# Patient Record
Sex: Female | Born: 1990 | Race: Black or African American | Hispanic: No | Marital: Single | Smoking: Never smoker
Health system: Southern US, Community
[De-identification: ages and names within clinical notes are randomized; demographics above are authoritative.]

## PROBLEM LIST (undated history)

## (undated) ENCOUNTER — Inpatient Hospital Stay (HOSPITAL_COMMUNITY): Payer: Self-pay

## (undated) DIAGNOSIS — O24419 Gestational diabetes mellitus in pregnancy, unspecified control: Secondary | ICD-10-CM

## (undated) DIAGNOSIS — E049 Nontoxic goiter, unspecified: Secondary | ICD-10-CM

## (undated) DIAGNOSIS — O139 Gestational [pregnancy-induced] hypertension without significant proteinuria, unspecified trimester: Secondary | ICD-10-CM

## (undated) HISTORY — PX: NO PAST SURGERIES: SHX2092

## (undated) HISTORY — DX: Nontoxic goiter, unspecified: E04.9

## (undated) HISTORY — DX: Gestational diabetes mellitus in pregnancy, unspecified control: O24.419

---
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
## 2009-04-22 ENCOUNTER — Emergency Department (HOSPITAL_COMMUNITY): Admission: EM | Admit: 2009-04-22 | Discharge: 2009-04-23 | Payer: Self-pay | Admitting: Emergency Medicine

## 2010-05-29 LAB — WET PREP, GENITAL
Trich, Wet Prep: NONE SEEN
Yeast Wet Prep HPF POC: NONE SEEN

## 2010-05-29 LAB — URINALYSIS, ROUTINE W REFLEX MICROSCOPIC
Bilirubin Urine: NEGATIVE
Ketones, ur: NEGATIVE mg/dL
Specific Gravity, Urine: 1.026 (ref 1.005–1.030)

## 2010-05-29 LAB — GC/CHLAMYDIA PROBE AMP, GENITAL: Chlamydia, DNA Probe: NEGATIVE

## 2010-05-29 LAB — POCT PREGNANCY, URINE: Preg Test, Ur: NEGATIVE

## 2010-06-25 ENCOUNTER — Emergency Department (HOSPITAL_COMMUNITY)
Admission: EM | Admit: 2010-06-25 | Discharge: 2010-06-25 | Discharge status: Against Medical Advice | Payer: Self-pay | Attending: Emergency Medicine | Admitting: Emergency Medicine

## 2010-06-25 DIAGNOSIS — M79609 Pain in unspecified limb: Secondary | ICD-10-CM | POA: Insufficient documentation

## 2010-06-26 ENCOUNTER — Emergency Department (HOSPITAL_COMMUNITY)
Admission: EM | Admit: 2010-06-26 | Discharge: 2010-06-26 | Disposition: A | Discharge status: Home / Self Care | Payer: Self-pay | Attending: Emergency Medicine | Admitting: Emergency Medicine

## 2010-06-26 DIAGNOSIS — M79609 Pain in unspecified limb: Secondary | ICD-10-CM | POA: Insufficient documentation

## 2010-06-26 DIAGNOSIS — M25476 Effusion, unspecified foot: Secondary | ICD-10-CM | POA: Insufficient documentation

## 2010-06-26 DIAGNOSIS — R609 Edema, unspecified: Secondary | ICD-10-CM | POA: Insufficient documentation

## 2010-06-26 DIAGNOSIS — M7989 Other specified soft tissue disorders: Secondary | ICD-10-CM | POA: Insufficient documentation

## 2010-06-26 DIAGNOSIS — M25473 Effusion, unspecified ankle: Secondary | ICD-10-CM | POA: Insufficient documentation

## 2010-06-26 LAB — POCT I-STAT, CHEM 8
Glucose, Bld: 85 mg/dL (ref 70–99)
HCT: 40 % (ref 36.0–46.0)
Hemoglobin: 13.6 g/dL (ref 12.0–15.0)
Sodium: 142 mEq/L (ref 135–145)

## 2010-11-19 ENCOUNTER — Encounter: Payer: Self-pay | Admitting: Family Medicine

## 2010-12-17 LAB — ANTIBODY SCREEN: Antibody Screen: NEGATIVE

## 2010-12-30 IMAGING — CR DG ABDOMEN 1V
1 series · 1 of 1 positions shown · non-contrast
Comparison: None.

CLINICAL DATA: Abdominal pain.  Pain with voiding.  Pain over the
bladder.

ABDOMEN - 1 VIEW

[t abdomen supine]
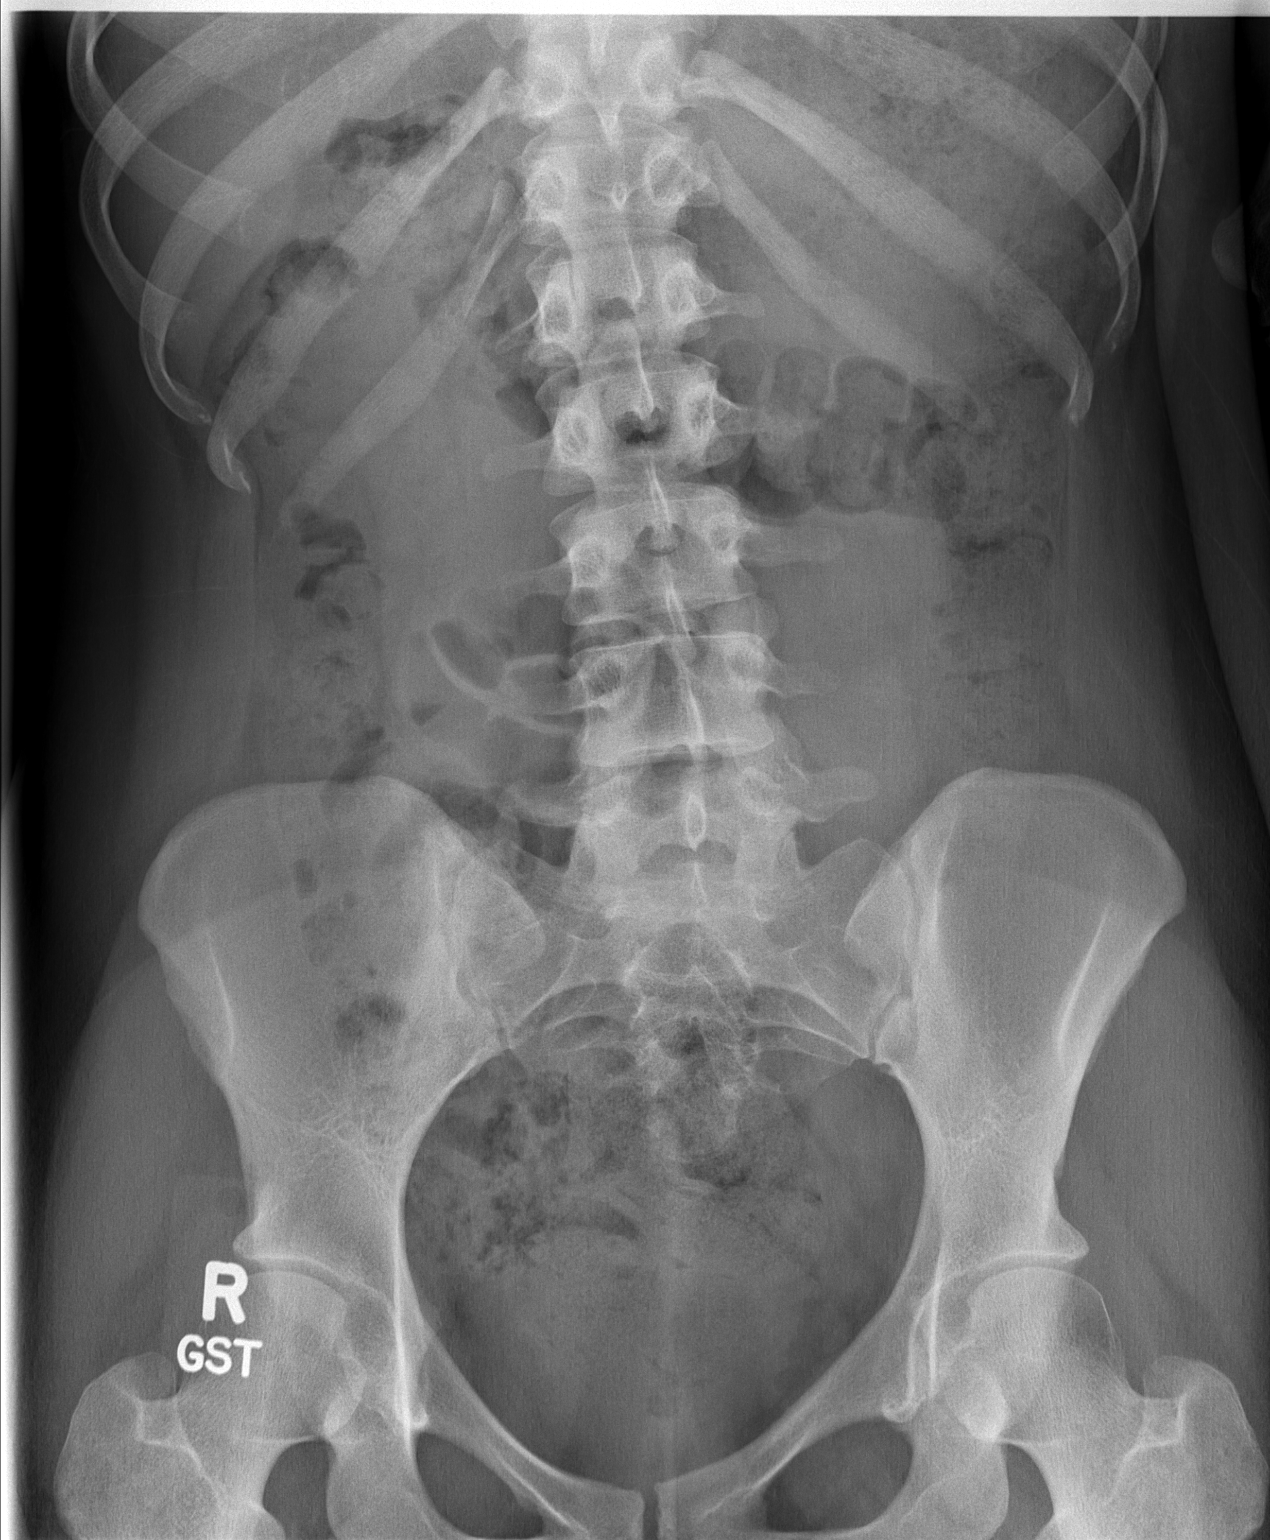

[1 of 1 positions shown; findings below may reference images not displayed]

FINDINGS: Bowel gas pattern is normal.  Stool and bowel gas extend
to the level of the rectosigmoid.  Bones appear within normal
limits.
IMPRESSION: Normal abdominal radiographs.

## 2011-03-11 NOTE — L&D Delivery Note (Signed)
Delivery Note At 7:53 PM a viable female was delivered via Vaginal, Spontaneous Delivery (Presentation vertex, Direct Occiput Anterior).  APGAR: 9, 9; weight 7 lb 14 oz .   Placenta status: Intact, Spontaneous.  Cord: 3 vessels with the following complications: None.  Post delivery brisk bleeding noted, bimanual massage resulted in firming up of the uterus and bleeding stopped except for at laceration.  Anesthesia: Epidural Local  Episiotomy: None Lacerations: 2nd degree;Perineal Suture Repair: 2.0 Vicryl Est. Blood Loss (mL): 650  Mom to postpartum.  Baby to nursery-stable.  Kennede Lusk S 06/10/2011, 8:12 PM

## 2011-04-15 ENCOUNTER — Inpatient Hospital Stay (HOSPITAL_COMMUNITY)
Admission: AD | Admit: 2011-04-15 | Discharge: 2011-04-15 | Disposition: A | Discharge status: Home / Self Care | Payer: Medicaid Other | Source: Ambulatory Visit | Attending: Obstetrics & Gynecology | Admitting: Obstetrics & Gynecology

## 2011-04-15 ENCOUNTER — Encounter (HOSPITAL_COMMUNITY): Payer: Self-pay | Admitting: *Deleted

## 2011-04-15 ENCOUNTER — Inpatient Hospital Stay (HOSPITAL_COMMUNITY): Payer: Medicaid Other

## 2011-04-15 ENCOUNTER — Encounter: Payer: Self-pay | Admitting: Obstetrics & Gynecology

## 2011-04-15 DIAGNOSIS — O4700 False labor before 37 completed weeks of gestation, unspecified trimester: Secondary | ICD-10-CM | POA: Insufficient documentation

## 2011-04-15 DIAGNOSIS — R109 Unspecified abdominal pain: Secondary | ICD-10-CM

## 2011-04-15 DIAGNOSIS — O283 Abnormal ultrasonic finding on antenatal screening of mother: Secondary | ICD-10-CM | POA: Insufficient documentation

## 2011-04-15 DIAGNOSIS — R1012 Left upper quadrant pain: Secondary | ICD-10-CM | POA: Insufficient documentation

## 2011-04-15 LAB — DIFFERENTIAL
Basophils Absolute: 0 10*3/uL (ref 0.0–0.1)
Basophils Relative: 0 % (ref 0–1)
Eosinophils Relative: 2 % (ref 0–5)
Lymphocytes Relative: 17 % (ref 12–46)
Lymphs Abs: 1.3 10*3/uL (ref 0.7–4.0)
Neutrophils Relative %: 77 % (ref 43–77)

## 2011-04-15 LAB — URINALYSIS, ROUTINE W REFLEX MICROSCOPIC
Bilirubin Urine: NEGATIVE
Glucose, UA: NEGATIVE mg/dL
Hgb urine dipstick: NEGATIVE
Leukocytes, UA: NEGATIVE
Protein, ur: NEGATIVE mg/dL

## 2011-04-15 LAB — CBC
HCT: 27.6 % — ABNORMAL LOW (ref 36.0–46.0)
MCHC: 33.7 g/dL (ref 30.0–36.0)
MCV: 93.9 fL (ref 78.0–100.0)
RBC: 2.94 MIL/uL — ABNORMAL LOW (ref 3.87–5.11)

## 2011-04-15 LAB — WET PREP, GENITAL
Clue Cells Wet Prep HPF POC: NONE SEEN
Trich, Wet Prep: NONE SEEN
Yeast Wet Prep HPF POC: NONE SEEN

## 2011-04-15 NOTE — ED Provider Notes (Signed)
History     Chief Complaint  Patient presents with  . Abdominal Pain   HPI 21 y/o F  G1P0 with 32.1 weeks that comes today complaining of +abdominal pain. The pain is located on left upper quadrant and irradiated down to her vagina. She reported had sexual intercourse in the morning and after that she started feeling the pain. No vaginal discharge, leaking fluid or bleeding. No headaches, epigastric pain, vision disturbances or lower extremity edema. No other complaints at this time. She had prenatal care at the beginning of her pregnancy but there was a problem with insurance and she was not able to get care after Nov/ 2012.  OB History    Grav Para Term Preterm Abortions TAB SAB Ect Mult Living   1               Past Medical History  Diagnosis Date  . No pertinent past medical history     Past Surgical History  Procedure Date  . No past surgeries     Family History  Problem Relation Age of Onset  . Diabetes Father   . Diabetes Paternal Grandfather     History  Substance Use Topics  . Smoking status: Never Smoker   . Smokeless tobacco: Not on file  . Alcohol Use: No    Allergies: No Known Allergies  Prescriptions prior to admission  Medication Sig Dispense Refill  . Prenatal Vit-Fe Fumarate-FA (PRENATAL MULTIVITAMIN) TABS Take 1 tablet by mouth daily.        Review of Systems  Constitutional: Negative for fever.  HENT: Negative.   Eyes: Negative for blurred vision.  Respiratory: Negative for cough.   Cardiovascular: Negative for chest pain and palpitations.  Gastrointestinal: Positive for abdominal pain. Negative for heartburn, nausea and vomiting.  Genitourinary: Negative for dysuria, urgency and frequency.  Musculoskeletal: Negative for back pain.  Skin: Negative.   Neurological: Negative.  Negative for headaches.   Physical Exam   Blood pressure 135/89, pulse 103, temperature 98.4 F (36.9 C), temperature source Oral, resp. rate 18, height 5' 3.5"  (1.613 m), weight 81.647 kg (180 lb).  Physical Exam  Constitutional: She is oriented to person, place, and time. No distress.  HENT:  Mouth/Throat: Oropharynx is clear and moist.  Eyes: Conjunctivae and EOM are normal. Pupils are equal, round, and reactive to light.  Neck: Neck supple.  Cardiovascular: Normal rate, regular rhythm, normal heart sounds and intact distal pulses.   No murmur heard. Respiratory: Effort normal and breath sounds normal. No respiratory distress.  GI: Bowel sounds are normal.       Normal abdominal physical exam of a 32 w gravid pt. No tenderness on palpation, rebound or guarding.  Genitourinary:       Speculum: minimal vaginal discharge. Normal cervix. Bimanual: cervix closed, long and posterior.  Musculoskeletal: She exhibits no edema.  Neurological: She is alert and oriented to person, place, and time. She has normal reflexes.  Skin: Skin is warm and dry.    MAU Course  Procedures  MDM FHT category 1 Toco irregular contractions that subside after oral hydration and rest.   GC and chlamydia collected Wet prep: negative for clue cells/yeast/tric OB U/S complete: with echogenic foci in LV Negative urinalysis w/micro  Assessment and Plan  21 y/o F  G1P0 with 32.1 weeks with abdominal pain and contractions after sexual activity this morning. Cervix unchanged, FHT category 1, no contractions after oral hydration. FFN not collected due to intercourse within 56  h.  Preterm labor signs and symptoms discussed. Communication sent to [employer] to call pt to schedule f/u   D. Piloto [employer]. MD PGY-1 04/15/2011, 3:09 PM

## 2011-04-15 NOTE — Progress Notes (Signed)
Pt in c/o left sided abdominal pain since yesterday.  States pain are sharp with occasional tightening.  States pain is worst with fetal movement on left side.  Worse with movement and sitting down. Denies any bleeding or leaking of fluid.  + FM.

## 2011-04-16 NOTE — ED Provider Notes (Signed)
Attestation of Attending Supervision of Resident: Evaluation and management procedures were performed by the [facility] Medicine Resident under my supervision.  I have reviewed the resident's note, chart reviewed and agree with management and plan.  Jaynie Collins, M.D. 04/16/2011 9:42 AM

## 2011-04-23 ENCOUNTER — Encounter: Payer: Self-pay | Admitting: Physician Assistant

## 2011-04-23 ENCOUNTER — Ambulatory Visit (INDEPENDENT_AMBULATORY_CARE_PROVIDER_SITE_OTHER): Payer: Self-pay | Admitting: Physician Assistant

## 2011-04-23 VITALS — BP 127/77 | Temp 98.2°F | Wt 197.1 lb

## 2011-04-23 DIAGNOSIS — O0930 Supervision of pregnancy with insufficient antenatal care, unspecified trimester: Secondary | ICD-10-CM

## 2011-04-23 DIAGNOSIS — E010 Iodine-deficiency related diffuse (endemic) goiter: Secondary | ICD-10-CM

## 2011-04-23 LAB — POCT URINALYSIS DIP (DEVICE)
Bilirubin Urine: NEGATIVE
Glucose, UA: NEGATIVE mg/dL
Ketones, ur: NEGATIVE mg/dL
Leukocytes, UA: NEGATIVE
Nitrite: NEGATIVE
Urobilinogen, UA: 0.2 mg/dL (ref 0.0–1.0)
pH: 7 (ref 5.0–8.0)

## 2011-04-23 NOTE — Progress Notes (Signed)
Lapse in care secondary to loss of insurance. Started with [facility] at 15 weeks, records in media. Size=dates. No complaints. 1 hour glucola today

## 2011-04-23 NOTE — Patient Instructions (Signed)
Pregnancy - Third Trimester The third trimester of pregnancy (the last 3 months) is a period of the most rapid growth for you and your baby. The baby approaches a length of 20 inches and a weight of 6 to 10 pounds. The baby is adding on fat and getting ready for life outside your body. While inside, babies have periods of sleeping and waking, suck their thumbs, and hiccups. You can often feel small contractions of the uterus. This is false labor. It is also called Braxton-Hicks contractions. This is like a practice for labor. The usual problems in this stage of pregnancy include more difficulty breathing, swelling of the hands and feet from water retention, and having to urinate more often because of the uterus and baby pressing on your bladder.  PRENATAL EXAMS  Blood work may continue to be done during prenatal exams. These tests are done to check on your health and the probable health of your baby. Blood work is used to follow your blood levels (hemoglobin). Anemia (low hemoglobin) is common during pregnancy. Iron and vitamins are given to help prevent this. You may also continue to be checked for diabetes. Some of the past blood tests may be done again.   The size of the uterus is measured during each visit. This makes sure your baby is growing properly according to your pregnancy dates.   Your blood pressure is checked every prenatal visit. This is to make sure you are not getting toxemia.   Your urine is checked every prenatal visit for infection, diabetes and protein.   Your weight is checked at each visit. This is done to make sure gains are happening at the suggested rate and that you and your baby are growing normally.   Sometimes, an ultrasound is performed to confirm the position and the proper growth and development of the baby. This is a test done that bounces harmless sound waves off the baby so your caregiver can more accurately determine due dates.   Discuss the type of pain  medication and anesthesia you will have during your labor and delivery.   Discuss the possibility and anesthesia if a Cesarean Section might be necessary.   Inform your caregiver if there is any mental or physical violence at home.  Sometimes, a specialized non-stress test, contraction stress test and biophysical profile are done to make sure the baby is not having a problem. Checking the amniotic fluid surrounding the baby is called an amniocentesis. The amniotic fluid is removed by sticking a needle into the belly (abdomen). This is sometimes done near the end of pregnancy if an early delivery is required. In this case, it is done to help make sure the baby's lungs are mature enough for the baby to live outside of the womb. If the lungs are not mature and it is unsafe to deliver the baby, an injection of cortisone medication is given to the mother 1 to 2 days before the delivery. This helps the baby's lungs mature and makes it safer to deliver the baby. CHANGES OCCURING IN THE THIRD TRIMESTER OF PREGNANCY Your body goes through many changes during pregnancy. They vary from person to person. Talk to your caregiver about changes you notice and are concerned about.  During the last trimester, you have probably had an increase in your appetite. It is normal to have cravings for certain foods. This varies from person to person and pregnancy to pregnancy.   You may begin to get stretch marks on your hips,   abdomen, and breasts. These are normal changes in the body during pregnancy. There are no exercises or medications to take which prevent this change.   Constipation may be treated with a stool softener or adding bulk to your diet. Drinking lots of fluids, fiber in vegetables, fruits, and whole grains are helpful.   Exercising is also helpful. If you have been very active up until your pregnancy, most of these activities can be continued during your pregnancy. If you have been less active, it is helpful  to start an exercise program such as walking. Consult your caregiver before starting exercise programs.   Avoid all smoking, alcohol, un-prescribed drugs, herbs and "street drugs" during your pregnancy. These chemicals affect the formation and growth of the baby. Avoid chemicals throughout the pregnancy to ensure the delivery of a healthy infant.   Backache, varicose veins and hemorrhoids may develop or get worse.   You will tire more easily in the third trimester, which is normal.   The baby's movements may be stronger and more often.   You may become short of breath easily.   Your belly button may stick out.   A yellow discharge may leak from your breasts called colostrum.   You may have a bloody mucus discharge. This usually occurs a few days to a week before labor begins.  HOME CARE INSTRUCTIONS   Keep your caregiver's appointments. Follow your caregiver's instructions regarding medication use, exercise, and diet.   During pregnancy, you are providing food for you and your baby. Continue to eat regular, well-balanced meals. Choose foods such as meat, fish, milk and other low fat dairy products, vegetables, fruits, and whole-grain breads and cereals. Your caregiver will tell you of the ideal weight gain.   A physical sexual relationship may be continued throughout pregnancy if there are no other problems such as early (premature) leaking of amniotic fluid from the membranes, vaginal bleeding, or belly (abdominal) pain.   Exercise regularly if there are no restrictions. Check with your caregiver if you are unsure of the safety of your exercises. Greater weight gain will occur in the last 2 trimesters of pregnancy. Exercising helps:   Control your weight.   Get you in shape for labor and delivery.   You lose weight after you deliver.   Rest a lot with legs elevated, or as needed for leg cramps or low back pain.   Wear a good support or jogging bra for breast tenderness during  pregnancy. This may help if worn during sleep. Pads or tissues may be used in the bra if you are leaking colostrum.   Do not use hot tubs, steam rooms, or saunas.   Wear your seat belt when driving. This protects you and your baby if you are in an accident.   Avoid raw meat, cat litter boxes and soil used by cats. These carry germs that can cause birth defects in the baby.   It is easier to loose urine during pregnancy. Tightening up and strengthening the pelvic muscles will help with this problem. You can practice stopping your urination while you are going to the bathroom. These are the same muscles you need to strengthen. It is also the muscles you would use if you were trying to stop from passing gas. You can practice tightening these muscles up 10 times a set and repeating this about 3 times per day. Once you know what muscles to tighten up, do not perform these exercises during urination. It is more likely   to cause an infection by backing up the urine.   Ask for help if you have financial, counseling or nutritional needs during pregnancy. Your caregiver will be able to offer counseling for these needs as well as refer you for other special needs.   Make a list of emergency phone numbers and have them available.   Plan on getting help from family or friends when you go home from the hospital.   Make a trial run to the hospital.   Take prenatal classes with the father to understand, practice and ask questions about the labor and delivery.   Prepare the baby's room/nursery.   Do not travel out of the city unless it is absolutely necessary and with the advice of your caregiver.   Wear only low or no heal shoes to have better balance and prevent falling.  MEDICATIONS AND DRUG USE IN PREGNANCY  Take prenatal vitamins as directed. The vitamin should contain 1 milligram of folic acid. Keep all vitamins out of reach of children. Only a couple vitamins or tablets containing iron may be fatal  to a baby or young child when ingested.   Avoid use of all medications, including herbs, over-the-counter medications, not prescribed or suggested by your caregiver. Only take over-the-counter or prescription medicines for pain, discomfort, or fever as directed by your caregiver. Do not use aspirin, ibuprofen (Motrin, Advil, Nuprin) or naproxen (Aleve) unless OK'd by your caregiver.   Let your caregiver also know about herbs you may be using.   Alcohol is related to a number of birth defects. This includes fetal alcohol syndrome. All alcohol, in any form, should be avoided completely. Smoking will cause low birth rate and premature babies.   Street/illegal drugs are very harmful to the baby. They are absolutely forbidden. A baby born to an addicted mother will be addicted at birth. The baby will go through the same withdrawal an adult does.  SEEK MEDICAL CARE IF: You have any concerns or worries during your pregnancy. It is better to call with your questions if you feel they cannot wait, rather than worry about them. DECISIONS ABOUT CIRCUMCISION You may or may not know the sex of your baby. If you know your baby is a boy, it may be time to think about circumcision. Circumcision is the removal of the foreskin of the penis. This is the skin that covers the sensitive end of the penis. There is no proven medical need for this. Often this decision is made on what is popular at the time or based upon religious beliefs and social issues. You can discuss these issues with your caregiver or pediatrician. SEEK IMMEDIATE MEDICAL CARE IF:   An unexplained oral temperature above 102 F (38.9 C) develops, or as your caregiver suggests.   You have leaking of fluid from the vagina (birth canal). If leaking membranes are suspected, take your temperature and tell your caregiver of this when you call.   There is vaginal spotting, bleeding or passing clots. Tell your caregiver of the amount and how many pads are  used.   You develop a bad smelling vaginal discharge with a change in the color from clear to white.   You develop vomiting that lasts more than 24 hours.   You develop chills or fever.   You develop shortness of breath.   You develop burning on urination.   You loose more than 2 pounds of weight or gain more than 2 pounds of weight or as suggested by your   caregiver.   You notice sudden swelling of your face, hands, and feet or legs.   You develop belly (abdominal) pain. Round ligament discomfort is a common non-cancerous (benign) cause of abdominal pain in pregnancy. Your caregiver still must evaluate you.   You develop a severe headache that does not go away.   You develop visual problems, blurred or double vision.   If you have not felt your baby move for more than 1 hour. If you think the baby is not moving as much as usual, eat something with sugar in it and lie down on your left side for an hour. The baby should move at least 4 to 5 times per hour. Call right away if your baby moves less than that.   You fall, are in a car accident or any kind of trauma.   There is mental or physical violence at home.  Document Released: 02/18/2001 Document Revised: 11/06/2010 Document Reviewed: 08/23/2008 [employer] Patient Information 2012 [employer]. 

## 2011-04-23 NOTE — Progress Notes (Signed)
1hr gtt today due at 1106

## 2011-04-27 LAB — CULTURE, OB URINE: Colony Count: >=100000

## 2011-04-30 ENCOUNTER — Ambulatory Visit (INDEPENDENT_AMBULATORY_CARE_PROVIDER_SITE_OTHER): Payer: Self-pay | Admitting: Obstetrics and Gynecology

## 2011-04-30 DIAGNOSIS — O0930 Supervision of pregnancy with insufficient antenatal care, unspecified trimester: Secondary | ICD-10-CM

## 2011-04-30 DIAGNOSIS — O0933 Supervision of pregnancy with insufficient antenatal care, third trimester: Secondary | ICD-10-CM

## 2011-04-30 DIAGNOSIS — O289 Unspecified abnormal findings on antenatal screening of mother: Secondary | ICD-10-CM

## 2011-04-30 DIAGNOSIS — O283 Abnormal ultrasonic finding on antenatal screening of mother: Secondary | ICD-10-CM

## 2011-04-30 LAB — POCT URINALYSIS DIP (DEVICE)
Bilirubin Urine: NEGATIVE
Glucose, UA: NEGATIVE mg/dL
Hgb urine dipstick: NEGATIVE
Leukocytes, UA: NEGATIVE
Nitrite: NEGATIVE
Urobilinogen, UA: 0.2 mg/dL (ref 0.0–1.0)
pH: 6.5 (ref 5.0–8.0)

## 2011-04-30 NOTE — Patient Instructions (Signed)
Pregnancy - Third Trimester The third trimester of pregnancy (the last 3 months) is a period of the most rapid growth for you and your baby. The baby approaches a length of 20 inches and a weight of 6 to 10 pounds. The baby is adding on fat and getting ready for life outside your body. While inside, babies have periods of sleeping and waking, suck their thumbs, and hiccups. You can often feel small contractions of the uterus. This is false labor. It is also called Braxton-Hicks contractions. This is like a practice for labor. The usual problems in this stage of pregnancy include more difficulty breathing, swelling of the hands and feet from water retention, and having to urinate more often because of the uterus and baby pressing on your bladder.  PRENATAL EXAMS  Blood work may continue to be done during prenatal exams. These tests are done to check on your health and the probable health of your baby. Blood work is used to follow your blood levels (hemoglobin). Anemia (low hemoglobin) is common during pregnancy. Iron and vitamins are given to help prevent this. You may also continue to be checked for diabetes. Some of the past blood tests may be done again.   The size of the uterus is measured during each visit. This makes sure your baby is growing properly according to your pregnancy dates.   Your blood pressure is checked every prenatal visit. This is to make sure you are not getting toxemia.   Your urine is checked every prenatal visit for infection, diabetes and protein.   Your weight is checked at each visit. This is done to make sure gains are happening at the suggested rate and that you and your baby are growing normally.   Sometimes, an ultrasound is performed to confirm the position and the proper growth and development of the baby. This is a test done that bounces harmless sound waves off the baby so your caregiver can more accurately determine due dates.   Discuss the type of pain  medication and anesthesia you will have during your labor and delivery.   Discuss the possibility and anesthesia if a Cesarean Section might be necessary.   Inform your caregiver if there is any mental or physical violence at home.  Sometimes, a specialized non-stress test, contraction stress test and biophysical profile are done to make sure the baby is not having a problem. Checking the amniotic fluid surrounding the baby is called an amniocentesis. The amniotic fluid is removed by sticking a needle into the belly (abdomen). This is sometimes done near the end of pregnancy if an early delivery is required. In this case, it is done to help make sure the baby's lungs are mature enough for the baby to live outside of the womb. If the lungs are not mature and it is unsafe to deliver the baby, an injection of cortisone medication is given to the mother 1 to 2 days before the delivery. This helps the baby's lungs mature and makes it safer to deliver the baby. CHANGES OCCURING IN THE THIRD TRIMESTER OF PREGNANCY Your body goes through many changes during pregnancy. They vary from person to person. Talk to your caregiver about changes you notice and are concerned about.  During the last trimester, you have probably had an increase in your appetite. It is normal to have cravings for certain foods. This varies from person to person and pregnancy to pregnancy.   You may begin to get stretch marks on your hips,   abdomen, and breasts. These are normal changes in the body during pregnancy. There are no exercises or medications to take which prevent this change.   Constipation may be treated with a stool softener or adding bulk to your diet. Drinking lots of fluids, fiber in vegetables, fruits, and whole grains are helpful.   Exercising is also helpful. If you have been very active up until your pregnancy, most of these activities can be continued during your pregnancy. If you have been less active, it is helpful  to start an exercise program such as walking. Consult your caregiver before starting exercise programs.   Avoid all smoking, alcohol, un-prescribed drugs, herbs and "street drugs" during your pregnancy. These chemicals affect the formation and growth of the baby. Avoid chemicals throughout the pregnancy to ensure the delivery of a healthy infant.   Backache, varicose veins and hemorrhoids may develop or get worse.   You will tire more easily in the third trimester, which is normal.   The baby's movements may be stronger and more often.   You may become short of breath easily.   Your belly button may stick out.   A yellow discharge may leak from your breasts called colostrum.   You may have a bloody mucus discharge. This usually occurs a few days to a week before labor begins.  HOME CARE INSTRUCTIONS   Keep your caregiver's appointments. Follow your caregiver's instructions regarding medication use, exercise, and diet.   During pregnancy, you are providing food for you and your baby. Continue to eat regular, well-balanced meals. Choose foods such as meat, fish, milk and other low fat dairy products, vegetables, fruits, and whole-grain breads and cereals. Your caregiver will tell you of the ideal weight gain.   A physical sexual relationship may be continued throughout pregnancy if there are no other problems such as early (premature) leaking of amniotic fluid from the membranes, vaginal bleeding, or belly (abdominal) pain.   Exercise regularly if there are no restrictions. Check with your caregiver if you are unsure of the safety of your exercises. Greater weight gain will occur in the last 2 trimesters of pregnancy. Exercising helps:   Control your weight.   Get you in shape for labor and delivery.   You lose weight after you deliver.   Rest a lot with legs elevated, or as needed for leg cramps or low back pain.   Wear a good support or jogging bra for breast tenderness during  pregnancy. This may help if worn during sleep. Pads or tissues may be used in the bra if you are leaking colostrum.   Do not use hot tubs, steam rooms, or saunas.   Wear your seat belt when driving. This protects you and your baby if you are in an accident.   Avoid raw meat, cat litter boxes and soil used by cats. These carry germs that can cause birth defects in the baby.   It is easier to loose urine during pregnancy. Tightening up and strengthening the pelvic muscles will help with this problem. You can practice stopping your urination while you are going to the bathroom. These are the same muscles you need to strengthen. It is also the muscles you would use if you were trying to stop from passing gas. You can practice tightening these muscles up 10 times a set and repeating this about 3 times per day. Once you know what muscles to tighten up, do not perform these exercises during urination. It is more likely   to cause an infection by backing up the urine.   Ask for help if you have financial, counseling or nutritional needs during pregnancy. Your caregiver will be able to offer counseling for these needs as well as refer you for other special needs.   Make a list of emergency phone numbers and have them available.   Plan on getting help from family or friends when you go home from the hospital.   Make a trial run to the hospital.   Take prenatal classes with the father to understand, practice and ask questions about the labor and delivery.   Prepare the baby's room/nursery.   Do not travel out of the city unless it is absolutely necessary and with the advice of your caregiver.   Wear only low or no heal shoes to have better balance and prevent falling.  MEDICATIONS AND DRUG USE IN PREGNANCY  Take prenatal vitamins as directed. The vitamin should contain 1 milligram of folic acid. Keep all vitamins out of reach of children. Only a couple vitamins or tablets containing iron may be fatal  to a baby or young child when ingested.   Avoid use of all medications, including herbs, over-the-counter medications, not prescribed or suggested by your caregiver. Only take over-the-counter or prescription medicines for pain, discomfort, or fever as directed by your caregiver. Do not use aspirin, ibuprofen (Motrin, Advil, Nuprin) or naproxen (Aleve) unless OK'd by your caregiver.   Let your caregiver also know about herbs you may be using.   Alcohol is related to a number of birth defects. This includes fetal alcohol syndrome. All alcohol, in any form, should be avoided completely. Smoking will cause low birth rate and premature babies.   Street/illegal drugs are very harmful to the baby. They are absolutely forbidden. A baby born to an addicted mother will be addicted at birth. The baby will go through the same withdrawal an adult does.  SEEK MEDICAL CARE IF: You have any concerns or worries during your pregnancy. It is better to call with your questions if you feel they cannot wait, rather than worry about them. DECISIONS ABOUT CIRCUMCISION You may or may not know the sex of your baby. If you know your baby is a boy, it may be time to think about circumcision. Circumcision is the removal of the foreskin of the penis. This is the skin that covers the sensitive end of the penis. There is no proven medical need for this. Often this decision is made on what is popular at the time or based upon religious beliefs and social issues. You can discuss these issues with your caregiver or pediatrician. SEEK IMMEDIATE MEDICAL CARE IF:   An unexplained oral temperature above 102 F (38.9 C) develops, or as your caregiver suggests.   You have leaking of fluid from the vagina (birth canal). If leaking membranes are suspected, take your temperature and tell your caregiver of this when you call.   There is vaginal spotting, bleeding or passing clots. Tell your caregiver of the amount and how many pads are  used.   You develop a bad smelling vaginal discharge with a change in the color from clear to white.   You develop vomiting that lasts more than 24 hours.   You develop chills or fever.   You develop shortness of breath.   You develop burning on urination.   You loose more than 2 pounds of weight or gain more than 2 pounds of weight or as suggested by your   caregiver.   You notice sudden swelling of your face, hands, and feet or legs.   You develop belly (abdominal) pain. Round ligament discomfort is a common non-cancerous (benign) cause of abdominal pain in pregnancy. Your caregiver still must evaluate you.   You develop a severe headache that does not go away.   You develop visual problems, blurred or double vision.   If you have not felt your baby move for more than 1 hour. If you think the baby is not moving as much as usual, eat something with sugar in it and lie down on your left side for an hour. The baby should move at least 4 to 5 times per hour. Call right away if your baby moves less than that.   You fall, are in a car accident or any kind of trauma.   There is mental or physical violence at home.  Document Released: 02/18/2001 Document Revised: 11/06/2010 Document Reviewed: 08/23/2008 [employer] Patient Information 2012 [employer]. 

## 2011-04-30 NOTE — Progress Notes (Signed)
Plans, labor signs, discomforts discussed. Will start PN classes.

## 2011-04-30 NOTE — Progress Notes (Signed)
Edema legs, feet, and hands. Pelvic pressure. No vaginal discharge. Pulse 98.

## 2011-05-14 ENCOUNTER — Ambulatory Visit (INDEPENDENT_AMBULATORY_CARE_PROVIDER_SITE_OTHER): Payer: Medicaid Other | Admitting: Advanced Practice Midwife

## 2011-05-14 VITALS — BP 136/90 | Temp 97.8°F | Wt 202.9 lb

## 2011-05-14 DIAGNOSIS — O0930 Supervision of pregnancy with insufficient antenatal care, unspecified trimester: Secondary | ICD-10-CM

## 2011-05-14 DIAGNOSIS — O289 Unspecified abnormal findings on antenatal screening of mother: Secondary | ICD-10-CM

## 2011-05-14 DIAGNOSIS — O283 Abnormal ultrasonic finding on antenatal screening of mother: Secondary | ICD-10-CM

## 2011-05-14 DIAGNOSIS — Z3400 Encounter for supervision of normal first pregnancy, unspecified trimester: Secondary | ICD-10-CM

## 2011-05-14 LAB — POCT URINALYSIS DIP (DEVICE)
Ketones, ur: NEGATIVE mg/dL
Protein, ur: NEGATIVE mg/dL
Specific Gravity, Urine: 1.02 (ref 1.005–1.030)
Urobilinogen, UA: 0.2 mg/dL (ref 0.0–1.0)
pH: 7 (ref 5.0–8.0)

## 2011-05-14 LAB — STREP B DNA PROBE: GBS: NEGATIVE

## 2011-05-14 NOTE — Progress Notes (Signed)
GBS, GC/CT. No HA, vision changes or epigastric pain. PIH precautions. Watch BPs.

## 2011-05-14 NOTE — Patient Instructions (Signed)
Fetal Movement Counts Patient Name: __________________________________________________ Patient Due Date: ____________________ Kick counts is highly recommended in high risk pregnancies, but it is a good idea for every pregnant woman to do. Start counting fetal movements at 28 weeks of the pregnancy. Fetal movements increase after eating a full meal or eating or drinking something sweet (the blood sugar is higher). It is also important to drink plenty of fluids (well hydrated) before doing the count. Lie on your left side because it helps with the circulation or you can sit in a comfortable chair with your arms over your belly (abdomen) with no distractions around you. DOING THE COUNT  Try to do the count the same time of day each time you do it.   Mark the day and time, then see how long it takes for you to feel 10 movements (kicks, flutters, swishes, rolls). You should have at least 10 movements within 2 hours. You will most likely feel 10 movements in much less than 2 hours. If you do not, wait an hour and count again. After a couple of days you will see a pattern.   What you are looking for is a change in the pattern or not enough counts in 2 hours. Is it taking longer in time to reach 10 movements?  SEEK MEDICAL CARE IF:  You feel less than 10 counts in 2 hours. Tried twice.   No movement in one hour.   The pattern is changing or taking longer each day to reach 10 counts in 2 hours.   You feel the baby is not moving as it usually does.  Date: ____________ Movements: ____________ Start time: ____________ Finish time: ____________  Date: ____________ Movements: ____________ Start time: ____________ Finish time: ____________ Date: ____________ Movements: ____________ Start time: ____________ Finish time: ____________ Date: ____________ Movements: ____________ Start time: ____________ Finish time: ____________ Date: ____________ Movements: ____________ Start time: ____________ Finish time:  ____________ Date: ____________ Movements: ____________ Start time: ____________ Finish time: ____________ Date: ____________ Movements: ____________ Start time: ____________ Finish time: ____________ Date: ____________ Movements: ____________ Start time: ____________ Finish time: ____________  Date: ____________ Movements: ____________ Start time: ____________ Finish time: ____________ Date: ____________ Movements: ____________ Start time: ____________ Finish time: ____________ Date: ____________ Movements: ____________ Start time: ____________ Finish time: ____________ Date: ____________ Movements: ____________ Start time: ____________ Finish time: ____________ Date: ____________ Movements: ____________ Start time: ____________ Finish time: ____________ Date: ____________ Movements: ____________ Start time: ____________ Finish time: ____________ Date: ____________ Movements: ____________ Start time: ____________ Finish time: ____________  Date: ____________ Movements: ____________ Start time: ____________ Finish time: ____________ Date: ____________ Movements: ____________ Start time: ____________ Finish time: ____________ Date: ____________ Movements: ____________ Start time: ____________ Finish time: ____________ Date: ____________ Movements: ____________ Start time: ____________ Finish time: ____________ Date: ____________ Movements: ____________ Start time: ____________ Finish time: ____________ Date: ____________ Movements: ____________ Start time: ____________ Finish time: ____________ Date: ____________ Movements: ____________ Start time: ____________ Finish time: ____________  Date: ____________ Movements: ____________ Start time: ____________ Finish time: ____________ Date: ____________ Movements: ____________ Start time: ____________ Finish time: ____________ Date: ____________ Movements: ____________ Start time: ____________ Finish time: ____________ Date: ____________ Movements:  ____________ Start time: ____________ Finish time: ____________ Date: ____________ Movements: ____________ Start time: ____________ Finish time: ____________ Date: ____________ Movements: ____________ Start time: ____________ Finish time: ____________ Date: ____________ Movements: ____________ Start time: ____________ Finish time: ____________  Date: ____________ Movements: ____________ Start time: ____________ Finish time: ____________ Date: ____________ Movements: ____________ Start time: ____________ Finish time: ____________ Date: ____________ Movements: ____________ Start time:   ____________ Finish time: ____________ Date: ____________ Movements: ____________ Start time: ____________ Finish time: ____________ Date: ____________ Movements: ____________ Start time: ____________ Finish time: ____________ Date: ____________ Movements: ____________ Start time: ____________ Finish time: ____________ Date: ____________ Movements: ____________ Start time: ____________ Finish time: ____________  Date: ____________ Movements: ____________ Start time: ____________ Finish time: ____________ Date: ____________ Movements: ____________ Start time: ____________ Finish time: ____________ Date: ____________ Movements: ____________ Start time: ____________ Finish time: ____________ Date: ____________ Movements: ____________ Start time: ____________ Finish time: ____________ Date: ____________ Movements: ____________ Start time: ____________ Finish time: ____________ Date: ____________ Movements: ____________ Start time: ____________ Finish time: ____________ Date: ____________ Movements: ____________ Start time: ____________ Finish time: ____________  Date: ____________ Movements: ____________ Start time: ____________ Finish time: ____________ Date: ____________ Movements: ____________ Start time: ____________ Finish time: ____________ Date: ____________ Movements: ____________ Start time: ____________ Finish  time: ____________ Date: ____________ Movements: ____________ Start time: ____________ Finish time: ____________ Date: ____________ Movements: ____________ Start time: ____________ Finish time: ____________ Date: ____________ Movements: ____________ Start time: ____________ Finish time: ____________ Date: ____________ Movements: ____________ Start time: ____________ Finish time: ____________  Date: ____________ Movements: ____________ Start time: ____________ Finish time: ____________ Date: ____________ Movements: ____________ Start time: ____________ Finish time: ____________ Date: ____________ Movements: ____________ Start time: ____________ Finish time: ____________ Date: ____________ Movements: ____________ Start time: ____________ Finish time: ____________ Date: ____________ Movements: ____________ Start time: ____________ Finish time: ____________ Date: ____________ Movements: ____________ Start time: ____________ Finish time: ____________ Document Released: 03/26/2006 Document Revised: 02/13/2011 Document Reviewed: 09/26/2008 [employer] Patient Information 2012 [employer].Normal Labor and Delivery Your caregiver must first be sure you are in labor. Signs of labor include:  You may pass what is called "the mucus plug" before labor begins. This is a small amount of blood stained mucus.   Regular uterine contractions.   The time between contractions get closer together.   The discomfort and pain gradually gets more intense.   Pains are mostly located in the back.   Pains get worse when walking.   The cervix (the opening of the uterus becomes thinner (begins to efface) and opens up (dilates).  Once you are in labor and admitted into the hospital or care center, your caregiver will do the following:  A complete physical examination.   Check your vital signs (blood pressure, pulse, temperature and the fetal heart rate).   Do a vaginal examination (using a sterile glove and  lubricant) to determine:   The position (presentation) of the baby (head [vertex] or buttock first).   The level (station) of the baby's head in the birth canal.   The effacement and dilatation of the cervix.   You may have your pubic hair shaved and be given an enema depending on your caregiver and the circumstance.   An electronic monitor is usually placed on your abdomen. The monitor follows the length and intensity of the contractions, as well as the baby's heart rate.   Usually, your caregiver will insert an IV in your arm with a bottle of sugar water. This is done as a precaution so that medications can be given to you quickly during labor or delivery.  NORMAL LABOR AND DELIVERY IS DIVIDED UP INTO 3 STAGES: First Stage This is when regular contractions begin and the cervix begins to efface and dilate. This stage can last from 3 to 15 hours. The end of the first stage is when the cervix is 100% effaced and 10 centimeters dilated. Pain medications may be given by   Injection (morphine, demerol,   etc.)   Regional anesthesia (spinal, caudal or epidural, anesthetics given in different locations of the spine). Paracervical pain medication may be given, which is an injection of and anesthetic on each side of the cervix.  A pregnant woman may request to have "Natural Childbirth" which is not to have any medications or anesthesia during her labor and delivery. Second Stage This is when the baby comes down through the birth canal (vagina) and is born. This can take 1 to 4 hours. As the baby's head comes down through the birth canal, you may feel like you are going to have a bowel movement. You will get the urge to bear down and push until the baby is delivered. As the baby's head is being delivered, the caregiver will decide if an episiotomy (a cut in the perineum and vagina area) is needed to prevent tearing of the tissue in this area. The episiotomy is sewn up after the delivery of the baby and  placenta. Sometimes a mask with nitrous oxide is given for the mother to breath during the delivery of the baby to help if there is too much pain. The end of Stage 2 is when the baby is fully delivered. Then when the umbilical cord stops pulsating it is clamped and cut. Third Stage The third stage begins after the baby is completely delivered and ends after the placenta (afterbirth) is delivered. This usually takes 5 to 30 minutes. After the placenta is delivered, a medication is given either by intravenous or injection to help contract the uterus and prevent bleeding. The third stage is not painful and pain medication is usually not necessary. If an episiotomy was done, it is repaired at this time. After the delivery, the mother is watched and monitored closely for 1 to 2 hours to make sure there is no postpartum bleeding (hemorrhage). If there is a lot of bleeding, medication is given to contract the uterus and stop the bleeding. Document Released: 12/04/2007 Document Revised: 02/13/2011 Document Reviewed: 12/04/2007 [employer] Patient Information 2012 [employer].  

## 2011-05-14 NOTE — Progress Notes (Signed)
Edema-feet, hands, legs, and ankles. Pelvic pressure. Pulse 100.

## 2011-05-21 ENCOUNTER — Encounter: Payer: Self-pay | Admitting: *Deleted

## 2011-05-21 ENCOUNTER — Ambulatory Visit (INDEPENDENT_AMBULATORY_CARE_PROVIDER_SITE_OTHER): Payer: Medicaid Other | Admitting: Family Medicine

## 2011-05-21 ENCOUNTER — Encounter: Payer: Medicaid Other | Admitting: Family Medicine

## 2011-05-21 ENCOUNTER — Encounter: Payer: Self-pay | Admitting: Family Medicine

## 2011-05-21 VITALS — BP 115/74 | Temp 97.1°F | Wt 205.4 lb

## 2011-05-21 DIAGNOSIS — Z23 Encounter for immunization: Secondary | ICD-10-CM

## 2011-05-21 DIAGNOSIS — O0930 Supervision of pregnancy with insufficient antenatal care, unspecified trimester: Secondary | ICD-10-CM

## 2011-05-21 DIAGNOSIS — O0933 Supervision of pregnancy with insufficient antenatal care, third trimester: Secondary | ICD-10-CM

## 2011-05-21 LAB — POCT URINALYSIS DIP (DEVICE)
Hgb urine dipstick: NEGATIVE
Nitrite: NEGATIVE
Protein, ur: 30 mg/dL — AB
Specific Gravity, Urine: 1.02 (ref 1.005–1.030)
Urobilinogen, UA: 0.2 mg/dL (ref 0.0–1.0)
pH: 6.5 (ref 5.0–8.0)

## 2011-05-21 MED ORDER — TETANUS-DIPHTH-ACELL PERTUSSIS 5-2.5-18.5 LF-MCG/0.5 IM SUSP
0.5000 mL | Freq: Once | INTRAMUSCULAR | Status: AC
  Administered 2011-05-21: 0.5 mL via INTRAMUSCULAR

## 2011-05-21 NOTE — Patient Instructions (Signed)
Pregnancy - Third Trimester The third trimester of pregnancy (the last 3 months) is a period of the most rapid growth for you and your baby. The baby approaches a length of 20 inches and a weight of 6 to 10 pounds. The baby is adding on fat and getting ready for life outside your body. While inside, babies have periods of sleeping and waking, suck their thumbs, and hiccups. You can often feel small contractions of the uterus. This is false labor. It is also called Braxton-Hicks contractions. This is like a practice for labor. The usual problems in this stage of pregnancy include more difficulty breathing, swelling of the hands and feet from water retention, and having to urinate more often because of the uterus and baby pressing on your bladder.  PRENATAL EXAMS  Blood work may continue to be done during prenatal exams. These tests are done to check on your health and the probable health of your baby. Blood work is used to follow your blood levels (hemoglobin). Anemia (low hemoglobin) is common during pregnancy. Iron and vitamins are given to help prevent this. You may also continue to be checked for diabetes. Some of the past blood tests may be done again.   The size of the uterus is measured during each visit. This makes sure your baby is growing properly according to your pregnancy dates.   Your blood pressure is checked every prenatal visit. This is to make sure you are not getting toxemia.   Your urine is checked every prenatal visit for infection, diabetes and protein.   Your weight is checked at each visit. This is done to make sure gains are happening at the suggested rate and that you and your baby are growing normally.   Sometimes, an ultrasound is performed to confirm the position and the proper growth and development of the baby. This is a test done that bounces harmless sound waves off the baby so your caregiver can more accurately determine due dates.   Discuss the type of pain  medication and anesthesia you will have during your labor and delivery.   Discuss the possibility and anesthesia if a Cesarean Section might be necessary.   Inform your caregiver if there is any mental or physical violence at home.  Sometimes, a specialized non-stress test, contraction stress test and biophysical profile are done to make sure the baby is not having a problem. Checking the amniotic fluid surrounding the baby is called an amniocentesis. The amniotic fluid is removed by sticking a needle into the belly (abdomen). This is sometimes done near the end of pregnancy if an early delivery is required. In this case, it is done to help make sure the baby's lungs are mature enough for the baby to live outside of the womb. If the lungs are not mature and it is unsafe to deliver the baby, an injection of cortisone medication is given to the mother 1 to 2 days before the delivery. This helps the baby's lungs mature and makes it safer to deliver the baby. CHANGES OCCURING IN THE THIRD TRIMESTER OF PREGNANCY Your body goes through many changes during pregnancy. They vary from person to person. Talk to your caregiver about changes you notice and are concerned about.  During the last trimester, you have probably had an increase in your appetite. It is normal to have cravings for certain foods. This varies from person to person and pregnancy to pregnancy.   You may begin to get stretch marks on your hips,   abdomen, and breasts. These are normal changes in the body during pregnancy. There are no exercises or medications to take which prevent this change.   Constipation may be treated with a stool softener or adding bulk to your diet. Drinking lots of fluids, fiber in vegetables, fruits, and whole grains are helpful.   Exercising is also helpful. If you have been very active up until your pregnancy, most of these activities can be continued during your pregnancy. If you have been less active, it is helpful  to start an exercise program such as walking. Consult your caregiver before starting exercise programs.   Avoid all smoking, alcohol, un-prescribed drugs, herbs and "street drugs" during your pregnancy. These chemicals affect the formation and growth of the baby. Avoid chemicals throughout the pregnancy to ensure the delivery of a healthy infant.   Backache, varicose veins and hemorrhoids may develop or get worse.   You will tire more easily in the third trimester, which is normal.   The baby's movements may be stronger and more often.   You may become short of breath easily.   Your belly button may stick out.   A yellow discharge may leak from your breasts called colostrum.   You may have a bloody mucus discharge. This usually occurs a few days to a week before labor begins.  HOME CARE INSTRUCTIONS   Keep your caregiver's appointments. Follow your caregiver's instructions regarding medication use, exercise, and diet.   During pregnancy, you are providing food for you and your baby. Continue to eat regular, well-balanced meals. Choose foods such as meat, fish, milk and other low fat dairy products, vegetables, fruits, and whole-grain breads and cereals. Your caregiver will tell you of the ideal weight gain.   A physical sexual relationship may be continued throughout pregnancy if there are no other problems such as early (premature) leaking of amniotic fluid from the membranes, vaginal bleeding, or belly (abdominal) pain.   Exercise regularly if there are no restrictions. Check with your caregiver if you are unsure of the safety of your exercises. Greater weight gain will occur in the last 2 trimesters of pregnancy. Exercising helps:   Control your weight.   Get you in shape for labor and delivery.   You lose weight after you deliver.   Rest a lot with legs elevated, or as needed for leg cramps or low back pain.   Wear a good support or jogging bra for breast tenderness during  pregnancy. This may help if worn during sleep. Pads or tissues may be used in the bra if you are leaking colostrum.   Do not use hot tubs, steam rooms, or saunas.   Wear your seat belt when driving. This protects you and your baby if you are in an accident.   Avoid raw meat, cat litter boxes and soil used by cats. These carry germs that can cause birth defects in the baby.   It is easier to loose urine during pregnancy. Tightening up and strengthening the pelvic muscles will help with this problem. You can practice stopping your urination while you are going to the bathroom. These are the same muscles you need to strengthen. It is also the muscles you would use if you were trying to stop from passing gas. You can practice tightening these muscles up 10 times a set and repeating this about 3 times per day. Once you know what muscles to tighten up, do not perform these exercises during urination. It is more likely   to cause an infection by backing up the urine.   Ask for help if you have financial, counseling or nutritional needs during pregnancy. Your caregiver will be able to offer counseling for these needs as well as refer you for other special needs.   Make a list of emergency phone numbers and have them available.   Plan on getting help from family or friends when you go home from the hospital.   Make a trial run to the hospital.   Take prenatal classes with the father to understand, practice and ask questions about the labor and delivery.   Prepare the baby's room/nursery.   Do not travel out of the city unless it is absolutely necessary and with the advice of your caregiver.   Wear only low or no heal shoes to have better balance and prevent falling.  MEDICATIONS AND DRUG USE IN PREGNANCY  Take prenatal vitamins as directed. The vitamin should contain 1 milligram of folic acid. Keep all vitamins out of reach of children. Only a couple vitamins or tablets containing iron may be fatal  to a baby or young child when ingested.   Avoid use of all medications, including herbs, over-the-counter medications, not prescribed or suggested by your caregiver. Only take over-the-counter or prescription medicines for pain, discomfort, or fever as directed by your caregiver. Do not use aspirin, ibuprofen (Motrin, Advil, Nuprin) or naproxen (Aleve) unless OK'd by your caregiver.   Let your caregiver also know about herbs you may be using.   Alcohol is related to a number of birth defects. This includes fetal alcohol syndrome. All alcohol, in any form, should be avoided completely. Smoking will cause low birth rate and premature babies.   Street/illegal drugs are very harmful to the baby. They are absolutely forbidden. A baby born to an addicted mother will be addicted at birth. The baby will go through the same withdrawal an adult does.  SEEK MEDICAL CARE IF: You have any concerns or worries during your pregnancy. It is better to call with your questions if you feel they cannot wait, rather than worry about them. DECISIONS ABOUT CIRCUMCISION You may or may not know the sex of your baby. If you know your baby is a boy, it may be time to think about circumcision. Circumcision is the removal of the foreskin of the penis. This is the skin that covers the sensitive end of the penis. There is no proven medical need for this. Often this decision is made on what is popular at the time or based upon religious beliefs and social issues. You can discuss these issues with your caregiver or pediatrician. SEEK IMMEDIATE MEDICAL CARE IF:   An unexplained oral temperature above 102 F (38.9 C) develops, or as your caregiver suggests.   You have leaking of fluid from the vagina (birth canal). If leaking membranes are suspected, take your temperature and tell your caregiver of this when you call.   There is vaginal spotting, bleeding or passing clots. Tell your caregiver of the amount and how many pads are  used.   You develop a bad smelling vaginal discharge with a change in the color from clear to white.   You develop vomiting that lasts more than 24 hours.   You develop chills or fever.   You develop shortness of breath.   You develop burning on urination.   You loose more than 2 pounds of weight or gain more than 2 pounds of weight or as suggested by your   caregiver.   You notice sudden swelling of your face, hands, and feet or legs.   You develop belly (abdominal) pain. Round ligament discomfort is a common non-cancerous (benign) cause of abdominal pain in pregnancy. Your caregiver still must evaluate you.   You develop a severe headache that does not go away.   You develop visual problems, blurred or double vision.   If you have not felt your baby move for more than 1 hour. If you think the baby is not moving as much as usual, eat something with sugar in it and lie down on your left side for an hour. The baby should move at least 4 to 5 times per hour. Call right away if your baby moves less than that.   You fall, are in a car accident or any kind of trauma.   There is mental or physical violence at home.  Document Released: 02/18/2001 Document Revised: 02/13/2011 Document Reviewed: 08/23/2008 [employer] Patient Information 2012 [employer]. 

## 2011-05-21 NOTE — Progress Notes (Signed)
P=103, discussed weight gain and that patient is over reccomended weight gain and to try to eat  More fruits, vegetables, less desserts, fast food, c/o increased edema in feet , legs, c/o increased pelvic pain, pressure, c/o watery milky discharge,discussed flu vaccine and tdap vaccine, wants tdap vaccine today,

## 2011-05-21 NOTE — Progress Notes (Signed)
Patient without complaints.  Denies vaginal bleeding, abnormal vaginal discharge, contractions, loss of fluid.  Reports good fetal activity.  Follow up in 1 weeks.  

## 2011-05-21 NOTE — Progress Notes (Signed)
Addended by: Faythe Casa on: 05/21/2011 12:55 PM   Modules accepted: Orders

## 2011-05-28 ENCOUNTER — Ambulatory Visit (INDEPENDENT_AMBULATORY_CARE_PROVIDER_SITE_OTHER): Payer: Medicaid Other | Admitting: Advanced Practice Midwife

## 2011-05-28 ENCOUNTER — Encounter: Payer: Self-pay | Admitting: *Deleted

## 2011-05-28 VITALS — BP 128/81 | Temp 97.4°F | Wt 207.6 lb

## 2011-05-28 DIAGNOSIS — O0933 Supervision of pregnancy with insufficient antenatal care, third trimester: Secondary | ICD-10-CM

## 2011-05-28 DIAGNOSIS — O0930 Supervision of pregnancy with insufficient antenatal care, unspecified trimester: Secondary | ICD-10-CM

## 2011-05-28 LAB — POCT URINALYSIS DIP (DEVICE)
Glucose, UA: NEGATIVE mg/dL
Hgb urine dipstick: NEGATIVE
Nitrite: NEGATIVE
Protein, ur: NEGATIVE mg/dL
Urobilinogen, UA: 0.2 mg/dL (ref 0.0–1.0)

## 2011-05-28 NOTE — Patient Instructions (Signed)
Normal Labor and Delivery Your caregiver must first be sure you are in labor. Signs of labor include:  You may pass what is called "the mucus plug" before labor begins. This is a small amount of blood stained mucus.   Regular uterine contractions.   The time between contractions get closer together.   The discomfort and pain gradually gets more intense.   Pains are mostly located in the back.   Pains get worse when walking.   The cervix (the opening of the uterus becomes thinner (begins to efface) and opens up (dilates).  Once you are in labor and admitted into the hospital or care center, your caregiver will do the following:  A complete physical examination.   Check your vital signs (blood pressure, pulse, temperature and the fetal heart rate).   Do a vaginal examination (using a sterile glove and lubricant) to determine:   The position (presentation) of the baby (head [vertex] or buttock first).   The level (station) of the baby's head in the birth canal.   The effacement and dilatation of the cervix.   You may have your pubic hair shaved and be given an enema depending on your caregiver and the circumstance.   An electronic monitor is usually placed on your abdomen. The monitor follows the length and intensity of the contractions, as well as the baby's heart rate.   Usually, your caregiver will insert an IV in your arm with a bottle of sugar water. This is done as a precaution so that medications can be given to you quickly during labor or delivery.  NORMAL LABOR AND DELIVERY IS DIVIDED UP INTO 3 STAGES: First Stage This is when regular contractions begin and the cervix begins to efface and dilate. This stage can last from 3 to 15 hours. The end of the first stage is when the cervix is 100% effaced and 10 centimeters dilated. Pain medications may be given by   Injection (morphine, demerol, etc.)   Regional anesthesia (spinal, caudal or epidural, anesthetics given in  different locations of the spine). Paracervical pain medication may be given, which is an injection of and anesthetic on each side of the cervix.  A pregnant woman may request to have "Natural Childbirth" which is not to have any medications or anesthesia during her labor and delivery. Second Stage This is when the baby comes down through the birth canal (vagina) and is born. This can take 1 to 4 hours. As the baby's head comes down through the birth canal, you may feel like you are going to have a bowel movement. You will get the urge to bear down and push until the baby is delivered. As the baby's head is being delivered, the caregiver will decide if an episiotomy (a cut in the perineum and vagina area) is needed to prevent tearing of the tissue in this area. The episiotomy is sewn up after the delivery of the baby and placenta. Sometimes a mask with nitrous oxide is given for the mother to breath during the delivery of the baby to help if there is too much pain. The end of Stage 2 is when the baby is fully delivered. Then when the umbilical cord stops pulsating it is clamped and cut. Third Stage The third stage begins after the baby is completely delivered and ends after the placenta (afterbirth) is delivered. This usually takes 5 to 30 minutes. After the placenta is delivered, a medication is given either by intravenous or injection to help contract   the uterus and prevent bleeding. The third stage is not painful and pain medication is usually not necessary. If an episiotomy was done, it is repaired at this time. After the delivery, the mother is watched and monitored closely for 1 to 2 hours to make sure there is no postpartum bleeding (hemorrhage). If there is a lot of bleeding, medication is given to contract the uterus and stop the bleeding. Document Released: 12/04/2007 Document Revised: 02/13/2011 Document Reviewed: 12/04/2007 [employer] Patient Information 2012 [employer]. 

## 2011-05-28 NOTE — Progress Notes (Signed)
Pulse- 98 

## 2011-05-28 NOTE — Progress Notes (Signed)
Patient doing well. No concerns today. Denies vaginal bleeding, LOF. States she is having increased mucus discharge. Good fetal movement. Irregular contractions. SVE fingertip/thick/-3 Patient has edema. Encouraged her to keep her legs elevated as much as possible.  Discussed labor and when she should be evaluated in the MAU.  Follow up in one week.

## 2011-06-02 ENCOUNTER — Inpatient Hospital Stay (HOSPITAL_COMMUNITY)
Admission: AD | Admit: 2011-06-02 | Discharge: 2011-06-03 | Disposition: A | Discharge status: Home / Self Care | Payer: Medicaid Other | Source: Ambulatory Visit | Attending: Obstetrics & Gynecology | Admitting: Obstetrics & Gynecology

## 2011-06-02 ENCOUNTER — Encounter (HOSPITAL_COMMUNITY): Payer: Self-pay

## 2011-06-02 DIAGNOSIS — O479 False labor, unspecified: Secondary | ICD-10-CM | POA: Insufficient documentation

## 2011-06-02 DIAGNOSIS — O283 Abnormal ultrasonic finding on antenatal screening of mother: Secondary | ICD-10-CM

## 2011-06-02 DIAGNOSIS — O133 Gestational [pregnancy-induced] hypertension without significant proteinuria, third trimester: Secondary | ICD-10-CM

## 2011-06-02 DIAGNOSIS — O471 False labor at or after 37 completed weeks of gestation: Secondary | ICD-10-CM

## 2011-06-02 DIAGNOSIS — O289 Unspecified abnormal findings on antenatal screening of mother: Secondary | ICD-10-CM

## 2011-06-02 DIAGNOSIS — O139 Gestational [pregnancy-induced] hypertension without significant proteinuria, unspecified trimester: Secondary | ICD-10-CM | POA: Insufficient documentation

## 2011-06-02 LAB — URINALYSIS, ROUTINE W REFLEX MICROSCOPIC
Hgb urine dipstick: NEGATIVE
Ketones, ur: NEGATIVE mg/dL
Protein, ur: NEGATIVE mg/dL
Urobilinogen, UA: 0.2 mg/dL (ref 0.0–1.0)

## 2011-06-02 LAB — URINE MICROSCOPIC-ADD ON

## 2011-06-02 NOTE — MAU Note (Signed)
Pt G1 at 39wks having pain with urination today.

## 2011-06-02 NOTE — MAU Note (Signed)
Patient is in for labor eval, she states that she is having intense pelvic/vaginal pressure. She reports good fetal movement.

## 2011-06-03 LAB — PROTEIN / CREATININE RATIO, URINE
Creatinine, Urine: 45.39 mg/dL
Protein Creatinine Ratio: 0.18 — ABNORMAL HIGH (ref 0.00–0.15)
Total Protein, Urine: 8.1 mg/dL

## 2011-06-03 LAB — COMPREHENSIVE METABOLIC PANEL
BUN: 10 mg/dL (ref 6–23)
Calcium: 9.3 mg/dL (ref 8.4–10.5)
Creatinine, Ser: 0.85 mg/dL (ref 0.50–1.10)
GFR calc Af Amer: >90 mL/min (ref >90)
GFR calc non Af Amer: >90 mL/min (ref >90)
Glucose, Bld: 86 mg/dL (ref 70–99)
Sodium: 136 mEq/L (ref 135–145)
Total Protein: 5.7 g/dL — ABNORMAL LOW (ref 6.0–8.3)

## 2011-06-03 LAB — CBC
HCT: 26.9 % — ABNORMAL LOW (ref 36.0–46.0)
Hemoglobin: 8.8 g/dL — ABNORMAL LOW (ref 12.0–15.0)
MCH: 30 pg (ref 26.0–34.0)
MCHC: 32.7 g/dL (ref 30.0–36.0)
MCV: 91.8 fL (ref 78.0–100.0)

## 2011-06-03 NOTE — ED Provider Notes (Signed)
History     CSN: [id number]  Arrival date and time: 06/02/11 2221   None     Chief Complaint  Patient presents with  . Dysuria   Patient is a 21 y.o. female presenting with dysuria.  Dysuria    Mary [name] 21 yo G1P0 female presents to the MAU with a CC of contractions and constant urination. She states that her contractions begin at approximately 5 pm today (7 hours ago). She reports having about 2 contractions every 10 min. When the contractions and pain became more frequent and severe she decided to come to the MAU. Once arriving, she has been having trouble controlling her bladder and feels the urge to constantly "tinkle." She denies any blood in the voided fluid but describes it as clear and yellowish. She admits to having prenatal care and previous notes indicated intermittent high blood pressures. She denies HA and vision changes but states she has been experiencing tingling in her arms and legs.   OB History    Grav Para Term Preterm Abortions TAB SAB Ect Mult Living   1               Past Medical History  Diagnosis Date  . No pertinent past medical history     Past Surgical History  Procedure Date  . No past surgeries     Family History  Problem Relation Age of Onset  . Diabetes Father   . Diabetes Paternal Grandfather     History  Substance Use Topics  . Smoking status: Never Smoker   . Smokeless tobacco: Not on file  . Alcohol Use: No    Allergies: No Known Allergies  Prescriptions prior to admission  Medication Sig Dispense Refill  . Prenatal Vit-Fe Fumarate-FA (PRENATAL MULTIVITAMIN) TABS Take 1 tablet by mouth every morning.         Review of Systems  Genitourinary: Positive for dysuria.   Physical Exam   Blood pressure 134/87, pulse 90, temperature 98.6 F (37 C), temperature source Oral, resp. rate 20, SpO2 97.00%.  Physical Exam  Constitutional: She appears well-developed and well-nourished. No distress.  HENT:  Head: Normocephalic and  atraumatic.  Genitourinary: Vagina normal. No vaginal discharge found.       No gross fluid or discharge noted during external exam.   Neurological:       Patellar DTRs 3+ noted bilaterally.    Skin: She is not diaphoretic.  Dilation: 1.5 Effacement (%): 60 Cervical Position: Middle Station: -3;-2 Presentation: Vertex Exam by:: Humphrey Rolls, RN  Results for orders placed during the hospital encounter of 06/02/11 (from the past 24 hour(s))  PROTEIN / CREATININE RATIO, URINE     Status: Abnormal   Collection Time   06/02/11 10:30 PM      Component Value Range   Creatinine, Urine 45.39     Total Protein, Urine 8.1     PROTEIN CREATININE RATIO 0.18 (*) 0.00 - 0.15   URINALYSIS, ROUTINE W REFLEX MICROSCOPIC     Status: Abnormal   Collection Time   06/02/11 10:38 PM      Component Value Range   Color, Urine YELLOW  YELLOW    APPearance CLEAR  CLEAR    Specific Gravity, Urine <1.005 (*) 1.005 - 1.030    pH 6.5  5.0 - 8.0    Glucose, UA NEGATIVE  NEGATIVE (mg/dL)   Hgb urine dipstick NEGATIVE  NEGATIVE    Bilirubin Urine NEGATIVE  NEGATIVE    Ketones, ur NEGATIVE  NEGATIVE (mg/dL)   Protein, ur NEGATIVE  NEGATIVE (mg/dL)   Urobilinogen, UA 0.2  0.0 - 1.0 (mg/dL)   Nitrite NEGATIVE  NEGATIVE    Leukocytes, UA SMALL (*) NEGATIVE   URINE MICROSCOPIC-ADD ON     Status: Normal   Collection Time   06/02/11 10:38 PM      Component Value Range   Squamous Epithelial / LPF RARE  RARE    WBC, UA 0-2  <3 (WBC/hpf)  CBC     Status: Abnormal   Collection Time   06/02/11 11:55 PM      Component Value Range   WBC 7.3  4.0 - 10.5 (K/uL)   RBC 2.93 (*) 3.87 - 5.11 (MIL/uL)   Hemoglobin 8.8 (*) 12.0 - 15.0 (g/dL)   HCT 16.1 (*) 09.6 - 46.0 (%)   MCV 91.8  78.0 - 100.0 (fL)   MCH 30.0  26.0 - 34.0 (pg)   MCHC 32.7  30.0 - 36.0 (g/dL)   RDW 04.5  40.9 - 81.1 (%)   Platelets 226  150 - 400 (K/uL)  COMPREHENSIVE METABOLIC PANEL     Status: Abnormal   Collection Time   06/02/11 11:55 PM       Component Value Range   Sodium 136  135 - 145 (mEq/L)   Potassium 4.3  3.5 - 5.1 (mEq/L)   Chloride 105  96 - 112 (mEq/L)   CO2 22  19 - 32 (mEq/L)   Glucose, Bld 86  70 - 99 (mg/dL)   BUN 10  6 - 23 (mg/dL)   Creatinine, Ser 9.14  0.50 - 1.10 (mg/dL)   Calcium 9.3  8.4 - 78.2 (mg/dL)   Total Protein 5.7 (*) 6.0 - 8.3 (g/dL)   Albumin 2.7 (*) 3.5 - 5.2 (g/dL)   AST 14  0 - 37 (U/L)   ALT 8  0 - 35 (U/L)   Alkaline Phosphatase 245 (*) 39 - 117 (U/L)   Total Bilirubin 0.2 (*) 0.3 - 1.2 (mg/dL)   GFR calc non Af Amer >90  >90 (mL/min)   GFR calc Af Amer >90  >90 (mL/min)   MAU Course  Procedures Per patient request membranes swept.   Assessment and Plan  1. IUP: 39.1 2. False Labor: Reassurance was given during the visit today. 3. Gestational Hypertension: Pt has agreed to collect and return with 24hr urine specimen.   Iverson Alamin 06/03/2011, 12:08 AM

## 2011-06-03 NOTE — ED Provider Notes (Signed)
I was present for the exam and agree with above. Fern negative x 3. Pool negative.  [name], Mary Behunin 06/03/2011 8:25 AM

## 2011-06-03 NOTE — Progress Notes (Signed)
Explained to pt that lab collect button was not clicked.  Informed pt that lab will probably take 2 more hours.  Pt verbalized understanding and is ok to wait on labs.  Ok to [employer] monitors per [employer]. Drink provided for pt.

## 2011-06-03 NOTE — MAU Note (Signed)
Mary [name], CNM at bedside for spec exam to R?O ROM as pt c/o leaking of fluid.

## 2011-06-03 NOTE — Progress Notes (Signed)
Fern slide collected 

## 2011-06-03 NOTE — MAU Note (Signed)
V. Trudel, CNM at bedside.  Assessment done and poc discussed with pt.  

## 2011-06-04 ENCOUNTER — Ambulatory Visit (INDEPENDENT_AMBULATORY_CARE_PROVIDER_SITE_OTHER): Payer: Medicaid Other | Admitting: Advanced Practice Midwife

## 2011-06-04 VITALS — BP 125/92 | Temp 97.2°F | Wt 212.4 lb

## 2011-06-04 DIAGNOSIS — O0930 Supervision of pregnancy with insufficient antenatal care, unspecified trimester: Secondary | ICD-10-CM

## 2011-06-04 DIAGNOSIS — O169 Unspecified maternal hypertension, unspecified trimester: Secondary | ICD-10-CM

## 2011-06-04 DIAGNOSIS — O0933 Supervision of pregnancy with insufficient antenatal care, third trimester: Secondary | ICD-10-CM

## 2011-06-04 DIAGNOSIS — O139 Gestational [pregnancy-induced] hypertension without significant proteinuria, unspecified trimester: Secondary | ICD-10-CM

## 2011-06-04 DIAGNOSIS — O283 Abnormal ultrasonic finding on antenatal screening of mother: Secondary | ICD-10-CM

## 2011-06-04 DIAGNOSIS — O289 Unspecified abnormal findings on antenatal screening of mother: Secondary | ICD-10-CM

## 2011-06-04 HISTORY — DX: Gestational (pregnancy-induced) hypertension without significant proteinuria, unspecified trimester: O13.9

## 2011-06-04 LAB — COMPREHENSIVE METABOLIC PANEL
ALT: <8 U/L (ref 0–35)
AST: 15 U/L (ref 0–37)
Albumin: 3.4 g/dL — ABNORMAL LOW (ref 3.5–5.2)
CO2: 19 mEq/L (ref 19–32)
Calcium: 8.7 mg/dL (ref 8.4–10.5)
Chloride: 109 mEq/L (ref 96–112)
Creat: 0.9 mg/dL (ref 0.50–1.10)
Potassium: 4.1 mEq/L (ref 3.5–5.3)
Sodium: 137 mEq/L (ref 135–145)
Total Protein: 5.8 g/dL — ABNORMAL LOW (ref 6.0–8.3)

## 2011-06-04 LAB — CBC
Platelets: 259 10*3/uL (ref 150–400)
RBC: 2.93 MIL/uL — ABNORMAL LOW (ref 3.87–5.11)
RDW: 14.4 % (ref 11.5–15.5)
WBC: 6.3 10*3/uL (ref 4.0–10.5)

## 2011-06-04 LAB — POCT URINALYSIS DIP (DEVICE)
Glucose, UA: NEGATIVE mg/dL
Nitrite: NEGATIVE
Protein, ur: NEGATIVE mg/dL
Urobilinogen, UA: 0.2 mg/dL (ref 0.0–1.0)

## 2011-06-04 NOTE — Progress Notes (Signed)
Pt was seen at MAU on 06/02/11 for contractions and urinary incontinence. During that visit she was found to have high BPs and hyperreflexia on exam. All lab work at that time neg for Preeclampsia but was sent home to collect a 24 hours urine which brings her in today. During today's visit, she reports feeling more comfortable when compared to 3/25 but describes feeling vaginal pressure and mild spotting. She denies HAs, vision changes, epigastric pain, and urinary incontinence. She requests for her cervix to "be checked again" and to have her membranes swept x2. On exam, Lungs: CTA bilaterally; Hrt: RRR, no murmurs notes; Neuro: 3+ DTRs throughout bilaterally.

## 2011-06-04 NOTE — Progress Notes (Signed)
I have seen and examined pt with PA student.  Overall doing well.  Wanted membranes stripped, done.  24 hour urine collected. Will repeat blood work.  Will call for induction if elevated protein, otherwise will plan induction at 40 weeks.

## 2011-06-04 NOTE — Patient Instructions (Signed)
Pregnancy - Third Trimester The third trimester of pregnancy (the last 3 months) is a period of the most rapid growth for you and your baby. The baby approaches a length of 20 inches and a weight of 6 to 10 pounds. The baby is adding on fat and getting ready for life outside your body. While inside, babies have periods of sleeping and waking, suck their thumbs, and hiccups. You can often feel small contractions of the uterus. This is false labor. It is also called Braxton-Hicks contractions. This is like a practice for labor. The usual problems in this stage of pregnancy include more difficulty breathing, swelling of the hands and feet from water retention, and having to urinate more often because of the uterus and baby pressing on your bladder.  PRENATAL EXAMS  Blood work may continue to be done during prenatal exams. These tests are done to check on your health and the probable health of your baby. Blood work is used to follow your blood levels (hemoglobin). Anemia (low hemoglobin) is common during pregnancy. Iron and vitamins are given to help prevent this. You may also continue to be checked for diabetes. Some of the past blood tests may be done again.   The size of the uterus is measured during each visit. This makes sure your baby is growing properly according to your pregnancy dates.   Your blood pressure is checked every prenatal visit. This is to make sure you are not getting toxemia.   Your urine is checked every prenatal visit for infection, diabetes and protein.   Your weight is checked at each visit. This is done to make sure gains are happening at the suggested rate and that you and your baby are growing normally.   Sometimes, an ultrasound is performed to confirm the position and the proper growth and development of the baby. This is a test done that bounces harmless sound waves off the baby so your caregiver can more accurately determine due dates.   Discuss the type of pain  medication and anesthesia you will have during your labor and delivery.   Discuss the possibility and anesthesia if a Cesarean Section might be necessary.   Inform your caregiver if there is any mental or physical violence at home.  Sometimes, a specialized non-stress test, contraction stress test and biophysical profile are done to make sure the baby is not having a problem. Checking the amniotic fluid surrounding the baby is called an amniocentesis. The amniotic fluid is removed by sticking a needle into the belly (abdomen). This is sometimes done near the end of pregnancy if an early delivery is required. In this case, it is done to help make sure the baby's lungs are mature enough for the baby to live outside of the womb. If the lungs are not mature and it is unsafe to deliver the baby, an injection of cortisone medication is given to the mother 1 to 2 days before the delivery. This helps the baby's lungs mature and makes it safer to deliver the baby. CHANGES OCCURING IN THE THIRD TRIMESTER OF PREGNANCY Your body goes through many changes during pregnancy. They vary from person to person. Talk to your caregiver about changes you notice and are concerned about.  During the last trimester, you have probably had an increase in your appetite. It is normal to have cravings for certain foods. This varies from person to person and pregnancy to pregnancy.   You may begin to get stretch marks on your hips,   abdomen, and breasts. These are normal changes in the body during pregnancy. There are no exercises or medications to take which prevent this change.   Constipation may be treated with a stool softener or adding bulk to your diet. Drinking lots of fluids, fiber in vegetables, fruits, and whole grains are helpful.   Exercising is also helpful. If you have been very active up until your pregnancy, most of these activities can be continued during your pregnancy. If you have been less active, it is helpful  to start an exercise program such as walking. Consult your caregiver before starting exercise programs.   Avoid all smoking, alcohol, un-prescribed drugs, herbs and "street drugs" during your pregnancy. These chemicals affect the formation and growth of the baby. Avoid chemicals throughout the pregnancy to ensure the delivery of a healthy infant.   Backache, varicose veins and hemorrhoids may develop or get worse.   You will tire more easily in the third trimester, which is normal.   The baby's movements may be stronger and more often.   You may become short of breath easily.   Your belly button may stick out.   A yellow discharge may leak from your breasts called colostrum.   You may have a bloody mucus discharge. This usually occurs a few days to a week before labor begins.  HOME CARE INSTRUCTIONS   Keep your caregiver's appointments. Follow your caregiver's instructions regarding medication use, exercise, and diet.   During pregnancy, you are providing food for you and your baby. Continue to eat regular, well-balanced meals. Choose foods such as meat, fish, milk and other low fat dairy products, vegetables, fruits, and whole-grain breads and cereals. Your caregiver will tell you of the ideal weight gain.   A physical sexual relationship may be continued throughout pregnancy if there are no other problems such as early (premature) leaking of amniotic fluid from the membranes, vaginal bleeding, or belly (abdominal) pain.   Exercise regularly if there are no restrictions. Check with your caregiver if you are unsure of the safety of your exercises. Greater weight gain will occur in the last 2 trimesters of pregnancy. Exercising helps:   Control your weight.   Get you in shape for labor and delivery.   You lose weight after you deliver.   Rest a lot with legs elevated, or as needed for leg cramps or low back pain.   Wear a good support or jogging bra for breast tenderness during  pregnancy. This may help if worn during sleep. Pads or tissues may be used in the bra if you are leaking colostrum.   Do not use hot tubs, steam rooms, or saunas.   Wear your seat belt when driving. This protects you and your baby if you are in an accident.   Avoid raw meat, cat litter boxes and soil used by cats. These carry germs that can cause birth defects in the baby.   It is easier to loose urine during pregnancy. Tightening up and strengthening the pelvic muscles will help with this problem. You can practice stopping your urination while you are going to the bathroom. These are the same muscles you need to strengthen. It is also the muscles you would use if you were trying to stop from passing gas. You can practice tightening these muscles up 10 times a set and repeating this about 3 times per day. Once you know what muscles to tighten up, do not perform these exercises during urination. It is more likely   to cause an infection by backing up the urine.   Ask for help if you have financial, counseling or nutritional needs during pregnancy. Your caregiver will be able to offer counseling for these needs as well as refer you for other special needs.   Make a list of emergency phone numbers and have them available.   Plan on getting help from family or friends when you go home from the hospital.   Make a trial run to the hospital.   Take prenatal classes with the father to understand, practice and ask questions about the labor and delivery.   Prepare the baby's room/nursery.   Do not travel out of the city unless it is absolutely necessary and with the advice of your caregiver.   Wear only low or no heal shoes to have better balance and prevent falling.  MEDICATIONS AND DRUG USE IN PREGNANCY  Take prenatal vitamins as directed. The vitamin should contain 1 milligram of folic acid. Keep all vitamins out of reach of children. Only a couple vitamins or tablets containing iron may be fatal  to a baby or young child when ingested.   Avoid use of all medications, including herbs, over-the-counter medications, not prescribed or suggested by your caregiver. Only take over-the-counter or prescription medicines for pain, discomfort, or fever as directed by your caregiver. Do not use aspirin, ibuprofen (Motrin, Advil, Nuprin) or naproxen (Aleve) unless OK'd by your caregiver.   Let your caregiver also know about herbs you may be using.   Alcohol is related to a number of birth defects. This includes fetal alcohol syndrome. All alcohol, in any form, should be avoided completely. Smoking will cause low birth rate and premature babies.   Street/illegal drugs are very harmful to the baby. They are absolutely forbidden. A baby born to an addicted mother will be addicted at birth. The baby will go through the same withdrawal an adult does.  SEEK MEDICAL CARE IF: You have any concerns or worries during your pregnancy. It is better to call with your questions if you feel they cannot wait, rather than worry about them. DECISIONS ABOUT CIRCUMCISION You may or may not know the sex of your baby. If you know your baby is a boy, it may be time to think about circumcision. Circumcision is the removal of the foreskin of the penis. This is the skin that covers the sensitive end of the penis. There is no proven medical need for this. Often this decision is made on what is popular at the time or based upon religious beliefs and social issues. You can discuss these issues with your caregiver or pediatrician. SEEK IMMEDIATE MEDICAL CARE IF:   An unexplained oral temperature above 102 F (38.9 C) develops, or as your caregiver suggests.   You have leaking of fluid from the vagina (birth canal). If leaking membranes are suspected, take your temperature and tell your caregiver of this when you call.   There is vaginal spotting, bleeding or passing clots. Tell your caregiver of the amount and how many pads are  used.   You develop a bad smelling vaginal discharge with a change in the color from clear to white.   You develop vomiting that lasts more than 24 hours.   You develop chills or fever.   You develop shortness of breath.   You develop burning on urination.   You loose more than 2 pounds of weight or gain more than 2 pounds of weight or as suggested by your   caregiver.   You notice sudden swelling of your face, hands, and feet or legs.   You develop belly (abdominal) pain. Round ligament discomfort is a common non-cancerous (benign) cause of abdominal pain in pregnancy. Your caregiver still must evaluate you.   You develop a severe headache that does not go away.   You develop visual problems, blurred or double vision.   If you have not felt your baby move for more than 1 hour. If you think the baby is not moving as much as usual, eat something with sugar in it and lie down on your left side for an hour. The baby should move at least 4 to 5 times per hour. Call right away if your baby moves less than that.   You fall, are in a car accident or any kind of trauma.   There is mental or physical violence at home.  Document Released: 02/18/2001 Document Revised: 02/13/2011 Document Reviewed: 08/23/2008 [employer] Patient Information 2012 [employer]. Contraception Choices Contraception (birth control) is the use of any methods or devices to prevent pregnancy. Below are some methods to help avoid pregnancy. HORMONAL METHODS   Contraceptive implant. This is a thin, plastic tube containing progesterone hormone. It does not contain estrogen hormone. Your caregiver inserts the tube in the inner part of the upper arm. The tube can remain in place for up to 3 years. After 3 years, the implant must be removed. The implant prevents the ovaries from releasing an egg (ovulation), thickens the cervical mucus which prevents sperm from entering the uterus, and thins the lining of the inside of the  uterus.   Progesterone-only injections. These injections are given every 3 months by your caregiver to prevent pregnancy. This synthetic progesterone hormone stops the ovaries from releasing eggs. It also thickens cervical mucus and changes the uterine lining. This makes it harder for sperm to survive in the uterus.   Birth control pills. These pills contain estrogen and progesterone hormone. They work by stopping the egg from forming in the ovary (ovulation). Birth control pills are prescribed by a caregiver.Birth control pills can also be used to treat heavy periods.   Minipill. This type of birth control pill contains only the progesterone hormone. They are taken every day of each month and must be prescribed by your caregiver.   Birth control patch. The patch contains hormones similar to those in birth control pills. It must be changed once a week and is prescribed by a caregiver.   Vaginal ring. The ring contains hormones similar to those in birth control pills. It is left in the vagina for 3 weeks, removed for 1 week, and then a new one is put back in place. The patient must be comfortable inserting and removing the ring from the vagina.A caregiver's prescription is necessary.   Emergency contraception. Emergency contraceptives prevent pregnancy after unprotected sexual intercourse. This pill can be taken right after sex or up to 5 days after unprotected sex. It is most effective the sooner you take the pills after having sexual intercourse. Emergency contraceptive pills are available without a prescription. Check with your pharmacist. Do not use emergency contraception as your only form of birth control.  BARRIER METHODS   Female condom. This is a thin sheath (latex or rubber) that is worn over the penis during sexual intercourse. It can be used with spermicide to increase effectiveness.   Female condom. This is a soft, loose-fitting sheath that is put into the vagina before sexual  intercourse.     Diaphragm. This is a soft, latex, dome-shaped barrier that must be fitted by a caregiver. It is inserted into the vagina, along with a spermicidal jelly. It is inserted before intercourse. The diaphragm should be left in the vagina for 6 to 8 hours after intercourse.   Cervical cap. This is a round, soft, latex or plastic cup that fits over the cervix and must be fitted by a caregiver. The cap can be left in place for up to 48 hours after intercourse.   Sponge. This is a soft, circular piece of polyurethane foam. The sponge has spermicide in it. It is inserted into the vagina after wetting it and before sexual intercourse.   Spermicides. These are chemicals that kill or block sperm from entering the cervix and uterus. They come in the form of creams, jellies, suppositories, foam, or tablets. They do not require a prescription. They are inserted into the vagina with an applicator before having sexual intercourse. The process must be repeated every time you have sexual intercourse.  INTRAUTERINE CONTRACEPTION  Intrauterine device (IUD). This is a T-shaped device that is put in a woman's uterus during a menstrual period to prevent pregnancy. There are 2 types:   Copper IUD. This type of IUD is wrapped in copper wire and is placed inside the uterus. Copper makes the uterus and fallopian tubes produce a fluid that kills sperm. It can stay in place for 10 years.   Hormone IUD. This type of IUD contains the hormone progestin (synthetic progesterone). The hormone thickens the cervical mucus and prevents sperm from entering the uterus, and it also thins the uterine lining to prevent implantation of a fertilized egg. The hormone can weaken or kill the sperm that get into the uterus. It can stay in place for 5 years.  PERMANENT METHODS OF CONTRACEPTION  Female tubal ligation. This is when the woman's fallopian tubes are surgically sealed, tied, or blocked to prevent the egg from traveling to  the uterus.   Female sterilization. This is when the female has the tubes that carry sperm tied off (vasectomy).This blocks sperm from entering the vagina during sexual intercourse. After the procedure, the man can still ejaculate fluid (semen).  NATURAL PLANNING METHODS  Natural family planning. This is not having sexual intercourse or using a barrier method (condom, diaphragm, cervical cap) on days the woman could become pregnant.   Calendar method. This is keeping track of the length of each menstrual cycle and identifying when you are fertile.   Ovulation method. This is avoiding sexual intercourse during ovulation.   Symptothermal method. This is avoiding sexual intercourse during ovulation, using a thermometer and ovulation symptoms.   Post-ovulation method. This is timing sexual intercourse after you have ovulated.  Regardless of which type or method of contraception you choose, it is important that you use condoms to protect against the transmission of sexually transmitted diseases (STDs). Talk with your caregiver about which form of contraception is most appropriate for you. Document Released: 02/24/2005 Document Revised: 02/13/2011 Document Reviewed: 07/03/2010 [employer] Patient Information 2012 [employer]. Breastfeeding BENEFITS OF BREASTFEEDING For the baby  The first milk (colostrum) helps the baby's digestive system function better.   There are antibodies from the mother in the milk that help the baby fight off infections.   The baby has a lower incidence of asthma, allergies, and SIDS (sudden infant death syndrome).   The nutrients in breast milk are better than formulas for the baby and helps the baby's brain grow better.     Babies who breastfeed have less gas, colic, and constipation.  For the mother  Breastfeeding helps develop a very special bond between mother and baby.   It is more convenient, always available at the correct temperature and cheaper than formula  feeding.   It burns calories in the mother and helps with losing weight that was gained during pregnancy.   It makes the uterus contract back down to normal size faster and slows bleeding following delivery.   Breastfeeding mothers have a lower risk of developing breast cancer.  NURSE FREQUENTLY  A healthy, full-term baby may breastfeed as often as every hour or space his or her feedings to every 3 hours.   How often to nurse will vary from baby to baby. Watch your baby for signs of hunger, not the clock.   Nurse as often as the baby requests, or when you feel the need to reduce the fullness of your breasts.   Awaken the baby if it has been 3 to 4 hours since the last feeding.   Frequent feeding will help the mother make more milk and will prevent problems like sore nipples and engorgement of the breasts.  BABY'S POSITION AT THE BREAST  Whether lying down or sitting, be sure that the baby's tummy is facing your tummy.   Support the breast with 4 fingers underneath the breast and the thumb above. Make sure your fingers are well away from the nipple and baby's mouth.   Stroke the baby's lips and cheek closest to the breast gently with your finger or nipple.   When the baby's mouth is open wide enough, place all of your nipple and as much of the dark area around the nipple as possible into your baby's mouth.   Pull the baby in close so the tip of the nose and the baby's cheeks touch the breast during the feeding.  FEEDINGS  The length of each feeding varies from baby to baby and from feeding to feeding.   The baby must suck about 2 to 3 minutes for your milk to get to him or her. This is called a "let down." For this reason, allow the baby to feed on each breast as long as he or she wants. Your baby will end the feeding when he or she has received the right balance of nutrients.   To break the suction, put your finger into the corner of the baby's mouth and slide it between his or her  gums before removing your breast from his or her mouth. This will help prevent sore nipples.  REDUCING BREAST ENGORGEMENT  In the first week after your baby is born, you may experience signs of breast engorgement. When breasts are engorged, they feel heavy, warm, full, and may be tender to the touch. You can reduce engorgement if you:   Nurse frequently, every 2 to 3 hours. Mothers who breastfeed early and often have fewer problems with engorgement.   Place light ice packs on your breasts between feedings. This reduces swelling. Wrap the ice packs in a lightweight towel to protect your skin.   Apply moist hot packs to your breast for 5 to 10 minutes before each feeding. This increases circulation and helps the milk flow.   Gently massage your breast before and during the feeding.   Make sure that the baby empties at least one breast at every feeding before switching sides.   Use a breast pump to empty the breasts if your baby is sleepy or   not nursing well. You may also want to pump if you are returning to work or or you feel you are getting engorged.   Avoid bottle feeds, pacifiers or supplemental feedings of water or juice in place of breastfeeding.   Be sure the baby is latched on and positioned properly while breastfeeding.   Prevent fatigue, stress, and anemia.   Wear a supportive bra, avoiding underwire styles.   Eat a balanced diet with enough fluids.  If you follow these suggestions, your engorgement should improve in 24 to 48 hours. If you are still experiencing difficulty, call your lactation consultant or caregiver. IS MY BABY GETTING ENOUGH MILK? Sometimes, mothers worry about whether their babies are getting enough milk. You can be assured that your baby is getting enough milk if:  The baby is actively sucking and you hear swallowing.   The baby nurses at least 8 to 12 times in a 24 hour time period. Nurse your baby until he or she unlatches or falls asleep at the first  breast (at least 10 to 20 minutes), then offer the second side.   The baby is wetting 5 to 6 disposable diapers (6 to 8 cloth diapers) in a 24 hour period by 5 to 6 days of age.   The baby is having at least 2 to 3 stools every 24 hours for the first few months. Breast milk is all the food your baby needs. It is not necessary for your baby to have water or formula. In fact, to help your breasts make more milk, it is best not to give your baby supplemental feedings during the early weeks.   The stool should be soft and yellow.   The baby should gain 4 to 7 ounces per week after he is 4 days old.  TAKE CARE OF YOURSELF Take care of your breasts by:  Bathing or showering daily.   Avoiding the use of soaps on your nipples.   Start feedings on your left breast at one feeding and on your right breast at the next feeding.   You will notice an increase in your milk supply 2 to 5 days after delivery. You may feel some discomfort from engorgement, which makes your breasts very firm and often tender. Engorgement "peaks" out within 24 to 48 hours. In the meantime, apply warm moist towels to your breasts for 5 to 10 minutes before feeding. Gentle massage and expression of some milk before feeding will soften your breasts, making it easier for your baby to latch on. Wear a well fitting nursing bra and air dry your nipples for 10 to 15 minutes after each feeding.   Only use cotton bra pads.   Only use pure lanolin on your nipples after nursing. You do not need to wash it off before nursing.  Take care of yourself by:   Eating well-balanced meals and nutritious snacks.   Drinking milk, fruit juice, and water to satisfy your thirst (about 8 glasses a day).   Getting plenty of rest.   Increasing calcium in your diet (1200 mg a day).   Avoiding foods that you notice affect the baby in a bad way.  SEEK MEDICAL CARE IF:   You have any questions or difficulty with breastfeeding.   You need help.    You have a hard, red, sore area on your breast, accompanied by a fever of 100.5 F (38.1 C) or more.   Your baby is too sleepy to eat well or is having   trouble sleeping.   Your baby is wetting less than 6 diapers per day, by 5 days of age.   Your baby's skin or white part of his or her eyes is more yellow than it was in the hospital.   You feel depressed.  Document Released: 02/24/2005 Document Revised: 02/13/2011 Document Reviewed: 10/09/2008 [employer] Patient Information 2012 [employer]. 

## 2011-06-04 NOTE — Progress Notes (Signed)
Pt was in the hospital on 06/02/11  Pulse- 91  Edema-feet, legs, hands.  Pain/pressure- vaginal  Pt c/o SOB while contracting

## 2011-06-05 ENCOUNTER — Telehealth: Payer: Self-pay | Admitting: Advanced Practice Midwife

## 2011-06-05 DIAGNOSIS — O169 Unspecified maternal hypertension, unspecified trimester: Secondary | ICD-10-CM | POA: Insufficient documentation

## 2011-06-05 LAB — CREATININE CLEARANCE, URINE, 24 HOUR: Creatinine, Urine: 81.7 mg/dL

## 2011-06-05 NOTE — Telephone Encounter (Signed)
Unable to reach pt at [phone]. Called father. Asked to have pt call [facility] [phone] to discuss IOL. States he will ask pt to call [facility].

## 2011-06-08 ENCOUNTER — Encounter (HOSPITAL_COMMUNITY): Payer: Self-pay | Admitting: Registered Nurse

## 2011-06-08 ENCOUNTER — Encounter (HOSPITAL_COMMUNITY): Payer: Self-pay

## 2011-06-08 ENCOUNTER — Inpatient Hospital Stay (HOSPITAL_COMMUNITY)
Admission: RE | Admit: 2011-06-08 | Discharge: 2011-06-12 | DRG: 775 | Disposition: A | Discharge status: Home / Self Care | Payer: Medicaid Other | Source: Ambulatory Visit | Attending: Obstetrics & Gynecology | Admitting: Obstetrics & Gynecology

## 2011-06-08 VITALS — BP 129/78 | HR 89 | Temp 98.2°F | Resp 20 | Ht 63.5 in | Wt 212.0 lb

## 2011-06-08 DIAGNOSIS — O283 Abnormal ultrasonic finding on antenatal screening of mother: Secondary | ICD-10-CM

## 2011-06-08 DIAGNOSIS — O139 Gestational [pregnancy-induced] hypertension without significant proteinuria, unspecified trimester: Principal | ICD-10-CM | POA: Diagnosis present

## 2011-06-08 HISTORY — DX: Gestational (pregnancy-induced) hypertension without significant proteinuria, unspecified trimester: O13.9

## 2011-06-08 LAB — CBC
Hemoglobin: 8.9 g/dL — ABNORMAL LOW (ref 12.0–15.0)
MCV: 92.3 fL (ref 78.0–100.0)
Platelets: 243 10*3/uL (ref 150–400)
RBC: 2.99 MIL/uL — ABNORMAL LOW (ref 3.87–5.11)
WBC: 6.8 10*3/uL (ref 4.0–10.5)

## 2011-06-08 LAB — RPR: RPR Ser Ql: NONREACTIVE

## 2011-06-08 MED ORDER — PHENYLEPHRINE 40 MCG/ML (10ML) SYRINGE FOR IV PUSH (FOR BLOOD PRESSURE SUPPORT): 80.0000 ug | PREFILLED_SYRINGE | INTRAVENOUS | Status: DC | PRN

## 2011-06-08 MED ORDER — CITRIC ACID-SODIUM CITRATE 334-500 MG/5ML PO SOLN: 30.0000 mL | ORAL | Status: DC | PRN

## 2011-06-08 MED ORDER — IBUPROFEN 600 MG PO TABS: 600.0000 mg | ORAL_TABLET | Freq: Four times a day (QID) | ORAL | Status: DC | PRN

## 2011-06-08 MED ORDER — EPHEDRINE 5 MG/ML INJ
10.0000 mg | INTRAVENOUS | Status: DC | PRN
  Filled 2011-06-08: qty 4

## 2011-06-08 MED ORDER — FLEET ENEMA 7-19 GM/118ML RE ENEM: 1.0000 | ENEMA | RECTAL | Status: DC | PRN

## 2011-06-08 MED ORDER — ONDANSETRON HCL 4 MG/2ML IJ SOLN: 4.0000 mg | Freq: Four times a day (QID) | INTRAMUSCULAR | Status: DC | PRN

## 2011-06-08 MED ORDER — LACTATED RINGERS IV SOLN
INTRAVENOUS | Status: DC
  Administered 2011-06-08 – 2011-06-09 (×4): via INTRAVENOUS
  Administered 2011-06-10: 125 mL/h via INTRAVENOUS

## 2011-06-08 MED ORDER — LIDOCAINE HCL (PF) 1 % IJ SOLN
30.0000 mL | INTRAMUSCULAR | Status: DC | PRN
  Administered 2011-06-10: 30 mL via SUBCUTANEOUS
  Filled 2011-06-08: qty 30

## 2011-06-08 MED ORDER — OXYTOCIN BOLUS FROM INFUSION
500.0000 mL | Freq: Once | INTRAVENOUS | Status: AC
  Administered 2011-06-10: 500 mL via INTRAVENOUS
  Filled 2011-06-08: qty 1000
  Filled 2011-06-08: qty 500
  Filled 2011-06-08: qty 1000

## 2011-06-08 MED ORDER — EPHEDRINE 5 MG/ML INJ: 10.0000 mg | INTRAVENOUS | Status: DC | PRN

## 2011-06-08 MED ORDER — FENTANYL 2.5 MCG/ML BUPIVACAINE 1/10 % EPIDURAL INFUSION (WH - ANES)
14.0000 mL/h | INTRAMUSCULAR | Status: DC
  Administered 2011-06-10 (×3): 14 mL/h via EPIDURAL
  Filled 2011-06-08 (×3): qty 60

## 2011-06-08 MED ORDER — OXYCODONE-ACETAMINOPHEN 5-325 MG PO TABS: 1.0000 | ORAL_TABLET | ORAL | Status: DC | PRN

## 2011-06-08 MED ORDER — ACETAMINOPHEN 325 MG PO TABS
650.0000 mg | ORAL_TABLET | ORAL | Status: DC | PRN
  Administered 2011-06-08 (×2): 650 mg via ORAL
  Filled 2011-06-08 (×2): qty 2

## 2011-06-08 MED ORDER — LACTATED RINGERS IV SOLN: 500.0000 mL | Freq: Once | INTRAVENOUS | Status: DC

## 2011-06-08 MED ORDER — OXYTOCIN 20 UNITS IN LACTATED RINGERS INFUSION - SIMPLE
125.0000 mL/h | Freq: Once | INTRAVENOUS | Status: AC
  Administered 2011-06-10: 125 mL/h via INTRAVENOUS

## 2011-06-08 MED ORDER — MISOPROSTOL 50MCG HALF TABLET
50.0000 ug | ORAL_TABLET | ORAL | Status: DC
  Administered 2011-06-08 – 2011-06-09 (×4): 50 ug via ORAL
  Filled 2011-06-08: qty 0.5
  Filled 2011-06-08 (×3): qty 1
  Filled 2011-06-08 (×3): qty 0.5
  Filled 2011-06-08: qty 1

## 2011-06-08 MED ORDER — DIPHENHYDRAMINE HCL 50 MG/ML IJ SOLN: 12.5000 mg | INTRAMUSCULAR | Status: DC | PRN

## 2011-06-08 MED ORDER — PHENYLEPHRINE 40 MCG/ML (10ML) SYRINGE FOR IV PUSH (FOR BLOOD PRESSURE SUPPORT)
80.0000 ug | PREFILLED_SYRINGE | INTRAVENOUS | Status: DC | PRN
  Filled 2011-06-08: qty 5

## 2011-06-08 MED ORDER — LACTATED RINGERS IV SOLN: 500.0000 mL | INTRAVENOUS | Status: DC | PRN

## 2011-06-08 NOTE — Progress Notes (Signed)
Updated Zerita Boers, CNM on SVE & requested induction orders.  Will start oral cytotec.

## 2011-06-08 NOTE — Progress Notes (Signed)
[phone])  IV starts attempted by Jerene Bears, RNC, Colette Ribas, & Zerita Boers, CNM. - Veins are small, roll, collapse & then infiltrate.  Notified Doreene Burke, CRNA.  Successful IV start in Left AC, 18g, by CRNA.  Total of 5 sticks. (RFA, LFA, LW (x2) unsuccessful.)

## 2011-06-08 NOTE — H&P (Signed)
Mary [name] is a 21 y.o. female presenting for IOL for GHTN. Prenatal care at [facility]. Patient states her BP has only been elevated in the last few months.  Patient states she does not feel contractions. Good fetal movement. Denies vaginal bleeding or LOF.  BP 144/99 on arrival to L&D. Denies HA, changes in vision, chest pain or dizziness. Protein:Creat ration 0.18 on 06/02/11. 24 hour urine protein 80.  Plans on breast feeding. Pediatrician is undecided. Would like OCP for PP contraception.   Maternal Medical History:   (Patient's mother had C-section due to maternal tumor preventing delivery)    OB History    Grav Para Term Preterm Abortions TAB SAB Ect Mult Living   1 0 0 0 0 0 0 0 0 0      Past Medical History  Diagnosis Date  . Pregnancy induced hypertension 06-04-11    gestational hypertention   Past Surgical History  Procedure Date  . No past surgeries    Family History: family history includes Diabetes in her father and paternal grandfather. Social History:  reports that she has never smoked. She does not have any smokeless tobacco history on file. She reports that she does not drink alcohol or use illicit drugs.  Review of Systems  Constitutional: Negative for fever and chills.  Respiratory: Negative for shortness of breath.   Cardiovascular: Negative for chest pain.  Gastrointestinal: Negative for vomiting and abdominal pain.  Genitourinary: Negative for dysuria.  Musculoskeletal: Positive for back pain.  Skin: Negative for rash.  Neurological: Negative for dizziness and headaches.     Blood pressure 144/99, pulse 111, temperature 99.3 F (37.4 C), temperature source Oral, resp. rate 20, SpO2 100.00%. Exam Physical Exam  Constitutional: She is oriented to person, place, and time. She appears well-developed and well-nourished. No distress.  HENT:  Head: Normocephalic and atraumatic.  Neck: Normal range of motion.  Cardiovascular: Normal rate.   Respiratory:  Effort normal.  GI: Soft.       Gravid. Toco in place.  Musculoskeletal: Normal range of motion. She exhibits no edema and no tenderness.  Neurological: She is alert and oriented to person, place, and time. No cranial nerve deficit.  Skin: Skin is dry. No rash noted.    Prenatal labs: ABO, Rh: --/--/A POS (02/05 1255) Antibody:   Rubella: 33.5 (02/05 1255) RPR: NON REAC (02/13 1052)  HBsAg: NEGATIVE (02/05 1255)  HIV: NON REACTIVE (02/13 1052)  GBS:   Negative  Assessment/Plan: Pt is a 21 yo G1P0 at [redacted]w[redacted]d presenting for IOL for GHTN. - Admit to birthing suites; attending Dr. Marice [name] - Closely monitor blood pressures. No indication for Mag at this time. Mary [name] over birth plan with patient, which she brought from home. - Will begin induction with cytotec vs foley bulb, followed by Pitocin when cervix is adequate. Plan discussed with patient who agrees with this plan. - Would like IV pain medication and epidural on request - Continue active management with close monitoring - Plan discussed with Mary [name], CNM  Mary [name] 06/08/2011, 8:52 AM

## 2011-06-08 NOTE — Progress Notes (Signed)
Strip did not record on OBIX from [phone]

## 2011-06-09 ENCOUNTER — Encounter (HOSPITAL_COMMUNITY): Payer: Self-pay

## 2011-06-09 MED ORDER — TERBUTALINE SULFATE 1 MG/ML IJ SOLN: 0.2500 mg | Freq: Once | INTRAMUSCULAR | Status: AC | PRN

## 2011-06-09 MED ORDER — OXYTOCIN 20 UNITS IN LACTATED RINGERS INFUSION - SIMPLE
1.0000 m[IU]/min | INTRAVENOUS | Status: DC
  Administered 2011-06-09: 2 m[IU]/min via INTRAVENOUS
  Administered 2011-06-10: 22 m[IU]/min via INTRAVENOUS
  Filled 2011-06-09 (×2): qty 1000

## 2011-06-09 MED ORDER — NALBUPHINE SYRINGE 5 MG/0.5 ML
10.0000 mg | INJECTION | INTRAMUSCULAR | Status: DC | PRN
  Administered 2011-06-09 – 2011-06-10 (×4): 10 mg via INTRAVENOUS
  Filled 2011-06-09: qty 0.5
  Filled 2011-06-09: qty 1
  Filled 2011-06-09: qty 0.5
  Filled 2011-06-09 (×3): qty 1

## 2011-06-09 NOTE — Progress Notes (Signed)
Mary [name] is a 21 y.o. G1P0000 at [redacted]w[redacted]d  Subjective: Doing well w/o complaints. Pain minimal. No urge to push. Foley bulb still in place.   Objective: BP 139/88  Pulse 85  Temp(Src) 97.7 F (36.5 C) (Oral)  Resp 20  Ht 5' 3.5" (1.613 m)  Wt 96.163 kg (212 lb)  BMI 36.97 kg/m2  SpO2 100% I/O last 3 completed shifts: In: -  Out: 200 [Urine:200]    FHT:  FHR: 130s bpm, variability: moderate,  accelerations:  Present,  decelerations:  Absent UC:   regular, every 2-3 minutes SVE:   Dilation: 1.5 Effacement (%): 70 Station: -3;-2 Exam by:: Erline Hau RNC  Labs: Lab Results  Component Value Date   WBC 6.8 06/08/2011   HGB 8.9* 06/08/2011   HCT 27.6* 06/08/2011   MCV 92.3 06/08/2011   PLT 243 06/08/2011    Assessment / Plan: Augmentation of labor, progressing well Anticipated MOD:  NSVD  Jaishawn Witzke 06/09/2011, 3:27 PM

## 2011-06-09 NOTE — Progress Notes (Signed)
Mary [name] is a 20 y.o. G1P0000 at [redacted]w[redacted]d   Subjective: Doing well. Complaining of mild nausea. Foley bulb came out  Objective: BP 141/76  Pulse 82  Temp(Src) 97.7 F (36.5 C) (Oral)  Resp 20  Ht 5' 3.5" (1.613 m)  Wt 96.163 kg (212 lb)  BMI 36.97 kg/m2  SpO2 100% I/O last 3 completed shifts: In: -  Out: 200 [Urine:200]    FHT:  FHR: 130s bpm, variability: minimal ,  accelerations:  Present,  decelerations:  Absent UC:   regular, every 2 minutes SVE:   Dilation: 3.5 (3 int os, 4 ext os) Effacement (%): 60 Station: -3 Exam by:: Dr Adriana Mccallum RNC  Labs: Lab Results  Component Value Date   WBC 6.8 06/08/2011   HGB 8.9* 06/08/2011   HCT 27.6* 06/08/2011   MCV 92.3 06/08/2011   PLT 243 06/08/2011    Assessment / Plan: Augmentation of labor, progressing well Anticipated MOD:  NSVD  Mary [name] 06/09/2011, 5:51 PM

## 2011-06-09 NOTE — Progress Notes (Signed)
  Subjective: Patient comfortable.   Objective: BP 103/54  Pulse 81  Temp(Src) 98.4 F (36.9 C) (Oral)  Resp 18  Ht 5' 3.5" (1.613 m)  Wt 96.163 kg (212 lb)  BMI 36.97 kg/m2  SpO2 100% I/O last 3 completed shifts: In: -  Out: 200 [Urine:200]    FHT:  FHR: 130s bpm, variability: moderate,  accelerations:  Present,  decelerations:  Absent UC:   regular, every 4-6 minutes SVE:   Dilation: 1.5 Effacement (%): 70 Station: -3;-2 Exam by:: Renaldo Harrison, RN  Labs: Lab Results  Component Value Date   WBC 6.8 06/08/2011   HGB 8.9* 06/08/2011   HCT 27.6* 06/08/2011   MCV 92.3 06/08/2011   PLT 243 06/08/2011    Assessment / Plan: Foley balloon placed.  Will start pitocin.  Mary [name] JEHIEL 06/09/2011, 9:53 AM

## 2011-06-09 NOTE — Progress Notes (Signed)
SAXON CROSBY is a 21 y.o. G1P0000 at [redacted]w[redacted]d by ultrasound admitted for induction of labor due to Hypertension.  Subjective:   Objective: BP 103/54  Pulse 81  Temp(Src) 98.4 F (36.9 C) (Oral)  Resp 20  Ht 5' 3.5" (1.613 m)  Wt 212 lb (96.163 kg)  BMI 36.97 kg/m2  SpO2 100%   Total I/O In: -  Out: 200 [Urine:200]  FHT:  FHR: 120 bpm, variability: minimal ,  accelerations:  Abscent,  decelerations:  Absent UC:   regular, every 4 minutes SVE:   Dilation: 1.5 Effacement (%): 70 Station: -3;-2 Exam by:: Renaldo Harrison, RN  Labs: Lab Results  Component Value Date   WBC 6.8 06/08/2011   HGB 8.9* 06/08/2011   HCT 27.6* 06/08/2011   MCV 92.3 06/08/2011   PLT 243 06/08/2011    Assessment / Plan: Induction of labor due to gestational hypertension,  progressing well on pitocin  Labor: on po cytotec Preeclampsia:  no signs or symptoms of toxicity Fetal Wellbeing:  Category II Pain Control:  nubain I/D:  n/a Anticipated MOD:  NSVD  Astou Lada DARLENE 06/09/2011, 6:45 AM

## 2011-06-09 NOTE — Progress Notes (Signed)
Subjective: Feeling contractions  Objective: BP 122/87  Pulse 86  Temp(Src) 98.3 F (36.8 C) (Oral)  Resp 18  Ht 5' 3.5" (1.613 m)  Wt 96.163 kg (212 lb)  BMI 36.97 kg/m2  SpO2 100% I/O last 3 completed shifts: In: -  Out: 200 [Urine:200]    FHT:  FHR: 140s bpm, variability: moderate,  accelerations:  Present,  decelerations:  Absent UC:   regular, every 2 minutes SVE:   Dilation: 4.5 Effacement (%): 70 Station: -3 Exam by:: Dr. Adrian Blackwater  Labs: Lab Results  Component Value Date   WBC 6.8 06/08/2011   HGB 8.9* 06/08/2011   HCT 27.6* 06/08/2011   MCV 92.3 06/08/2011   PLT 243 06/08/2011    Assessment / Plan: Continue with pitocin.  Category 1 tracing.  Continue expectant management.  Mary [name] JEHIEL 06/09/2011, 11:06 PM

## 2011-06-10 ENCOUNTER — Encounter (HOSPITAL_COMMUNITY): Payer: Self-pay

## 2011-06-10 ENCOUNTER — Encounter (HOSPITAL_COMMUNITY): Payer: Self-pay | Admitting: Anesthesiology

## 2011-06-10 ENCOUNTER — Inpatient Hospital Stay (HOSPITAL_COMMUNITY): Payer: Medicaid Other | Admitting: Anesthesiology

## 2011-06-10 DIAGNOSIS — O139 Gestational [pregnancy-induced] hypertension without significant proteinuria, unspecified trimester: Secondary | ICD-10-CM

## 2011-06-10 LAB — CBC
Hemoglobin: 7 g/dL — ABNORMAL LOW (ref 12.0–15.0)
MCH: 29.8 pg (ref 26.0–34.0)
MCH: 30 pg (ref 26.0–34.0)
MCHC: 32.5 g/dL (ref 30.0–36.0)
MCHC: 33 g/dL (ref 30.0–36.0)
Platelets: 230 10*3/uL (ref 150–400)
RBC: 3.15 MIL/uL — ABNORMAL LOW (ref 3.87–5.11)
RDW: 14.3 % (ref 11.5–15.5)

## 2011-06-10 MED ORDER — ZOLPIDEM TARTRATE 5 MG PO TABS: 5.0000 mg | ORAL_TABLET | Freq: Every evening | ORAL | Status: DC | PRN

## 2011-06-10 MED ORDER — OXYCODONE-ACETAMINOPHEN 5-325 MG PO TABS: 1.0000 | ORAL_TABLET | ORAL | Status: DC | PRN

## 2011-06-10 MED ORDER — MISOPROSTOL 200 MCG PO TABS
ORAL_TABLET | ORAL | Status: AC
  Filled 2011-06-10: qty 5

## 2011-06-10 MED ORDER — WITCH HAZEL-GLYCERIN EX PADS: 1.0000 "application " | MEDICATED_PAD | CUTANEOUS | Status: DC | PRN

## 2011-06-10 MED ORDER — BENZOCAINE-MENTHOL 20-0.5 % EX AERO
1.0000 "application " | INHALATION_SPRAY | CUTANEOUS | Status: DC | PRN
  Administered 2011-06-10: 1 via TOPICAL

## 2011-06-10 MED ORDER — LANOLIN HYDROUS EX OINT: TOPICAL_OINTMENT | CUTANEOUS | Status: DC | PRN

## 2011-06-10 MED ORDER — SIMETHICONE 80 MG PO CHEW
80.0000 mg | CHEWABLE_TABLET | ORAL | Status: DC | PRN
  Administered 2011-06-11: 80 mg via ORAL

## 2011-06-10 MED ORDER — BENZOCAINE-MENTHOL 20-0.5 % EX AERO
INHALATION_SPRAY | CUTANEOUS | Status: AC
  Administered 2011-06-10: 1 via TOPICAL
  Filled 2011-06-10: qty 56

## 2011-06-10 MED ORDER — DIPHENHYDRAMINE HCL 25 MG PO CAPS: 25.0000 mg | ORAL_CAPSULE | Freq: Four times a day (QID) | ORAL | Status: DC | PRN

## 2011-06-10 MED ORDER — MEASLES, MUMPS & RUBELLA VAC ~~LOC~~ INJ
0.5000 mL | INJECTION | Freq: Once | SUBCUTANEOUS | Status: DC
  Filled 2011-06-10: qty 0.5

## 2011-06-10 MED ORDER — IBUPROFEN 600 MG PO TABS
600.0000 mg | ORAL_TABLET | Freq: Four times a day (QID) | ORAL | Status: DC
  Administered 2011-06-10 – 2011-06-12 (×7): 600 mg via ORAL
  Filled 2011-06-10 (×7): qty 1

## 2011-06-10 MED ORDER — SENNOSIDES-DOCUSATE SODIUM 8.6-50 MG PO TABS
2.0000 | ORAL_TABLET | Freq: Every day | ORAL | Status: DC
  Administered 2011-06-11: 2 via ORAL

## 2011-06-10 MED ORDER — ONDANSETRON HCL 4 MG PO TABS: 4.0000 mg | ORAL_TABLET | ORAL | Status: DC | PRN

## 2011-06-10 MED ORDER — LIDOCAINE HCL (PF) 1 % IJ SOLN
INTRAMUSCULAR | Status: DC | PRN
  Administered 2011-06-10 (×2): 5 mL

## 2011-06-10 MED ORDER — ONDANSETRON HCL 4 MG/2ML IJ SOLN: 4.0000 mg | INTRAMUSCULAR | Status: DC | PRN

## 2011-06-10 MED ORDER — PRENATAL MULTIVITAMIN CH
1.0000 | ORAL_TABLET | Freq: Every day | ORAL | Status: DC
  Administered 2011-06-11 – 2011-06-12 (×2): 1 via ORAL
  Filled 2011-06-10 (×2): qty 1

## 2011-06-10 MED ORDER — DIBUCAINE 1 % RE OINT: 1.0000 "application " | TOPICAL_OINTMENT | RECTAL | Status: DC | PRN

## 2011-06-10 MED ORDER — TETANUS-DIPHTH-ACELL PERTUSSIS 5-2.5-18.5 LF-MCG/0.5 IM SUSP: 0.5000 mL | Freq: Once | INTRAMUSCULAR | Status: DC

## 2011-06-10 NOTE — Progress Notes (Signed)
Mary [name] is a 21 y.o. G1P0000 at [redacted]w[redacted]d  Subjective: Doing well. Pain much improved since starting epidural. No sensation to push.  Objective: BP 153/91  Pulse 108  Temp(Src) 98.2 F (36.8 C) (Oral)  Resp 18  Ht 5' 3.5" (1.613 m)  Wt 96.163 kg (212 lb)  BMI 36.97 kg/m2  SpO2 100% I/O last 3 completed shifts: In: -  Out: 200 [Urine:200]  Foley in place  FHT:  FHR: 140s bpm, variability: moderate,  accelerations:  Present,  decelerations:  Absent UC:   regular, every 2-3 minutes SVE:   Dilation: 8 Effacement (%): 90 Station: 0 Exam by:: Mary [name], CNM  Labs: Lab Results  Component Value Date   WBC 7.4 06/10/2011   HGB 9.4* 06/10/2011   HCT 28.9* 06/10/2011   MCV 91.7 06/10/2011   PLT 230 06/10/2011    Assessment / Plan: Augmentation of labor, progressing well  Labor: Progressing normally and continue on Pit Fetal Wellbeing:  Category I Pain Control:  Epidural I/D:  n/a Anticipated MOD:  NSVD  Mary [name] 06/10/2011, 2:18 PM

## 2011-06-10 NOTE — Anesthesia Procedure Notes (Signed)
Epidural Patient location during procedure: OB Start time: 06/10/2011 11:07 AM  Staffing Anesthesiologist: Brayton Caves R Performed by: anesthesiologist   Preanesthetic Checklist Completed: patient identified, site marked, surgical consent, pre-op evaluation, timeout performed, IV checked, risks and benefits discussed and monitors and equipment checked  Epidural Patient position: sitting Prep: site prepped and draped and DuraPrep Patient monitoring: continuous pulse ox and blood pressure Approach: midline Injection technique: LOR air and LOR saline  Needle:  Needle type: Tuohy  Needle gauge: 17 G Needle length: 9 cm Needle insertion depth: 7 cm Catheter type: closed end flexible Catheter size: 19 Gauge Catheter at skin depth: 12 cm Test dose: negative  Assessment Events: blood not aspirated, injection not painful, no injection resistance, negative IV test and no paresthesia  Additional Notes Patient identified.  Risk benefits discussed including failed block, incomplete pain control, headache, nerve damage, paralysis, blood pressure changes, nausea, vomiting, reactions to medication both toxic or allergic, and postpartum back pain.  Patient expressed understanding and wished to proceed.  All questions were answered.  Sterile technique used throughout procedure and epidural site dressed with sterile barrier dressing. No paresthesia or other complications noted.The patient did not experience any signs of intravascular injection such as tinnitus or metallic taste in mouth nor signs of intrathecal spread such as rapid motor block. Please see nursing notes for vital signs.

## 2011-06-10 NOTE — Progress Notes (Signed)
BAKER KOGLER is a 21 y.o. G1P0000 at [redacted]w[redacted]d   Subjective: In pain. Requesting epidural. Nubain not working. Felt gushing fluid loss.  Objective: BP 150/99  Pulse 88  Temp(Src) 97.8 F (36.6 C) (Oral)  Resp 18  Ht 5' 3.5" (1.613 m)  Wt 96.163 kg (212 lb)  BMI 36.97 kg/m2  SpO2 100% I/O last 3 completed shifts: In: -  Out: 200 [Urine:200]    FHT:  FHR: 130s bpm, variability: moderate,  accelerations:  Abscent,  decelerations:  Absent UC:   irregular, every 2-4 minutes SVE:   Dilation: 5 Effacement (%): 70 Station: -2 Exam by:: Beatriz Stallion, RN  Labs: Lab Results  Component Value Date   WBC 6.8 06/08/2011   HGB 8.9* 06/08/2011   HCT 27.6* 06/08/2011   MCV 92.3 06/08/2011   PLT 243 06/08/2011    Assessment / Plan: Spontaneous labor, progressing normally. ROM w/ clear fluid.   Labor: Progressing normally, SROM Pain Control:  Will order epidural Anticipated MOD:  NSVD  Patriciaann Rabanal 06/10/2011, 10:06 AM

## 2011-06-10 NOTE — Progress Notes (Addendum)
Mary [name] is a 21 y.o. G1P0000 at [redacted]w[redacted]d   Subjective: Doing well. Pain well controlled. Pt is starting to feel the urge to push  Objective: BP 143/85  Pulse 90  Temp(Src) 98.2 F (36.8 C) (Oral)  Resp 18  Ht 5' 3.5" (1.613 m)  Wt 96.163 kg (212 lb)  BMI 36.97 kg/m2  SpO2 100% I/O last 3 completed shifts: In: -  Out: 200 [Urine:200]    FHT:  FHR: 150s bpm, variability: moderate,  accelerations:  Present,  decelerations:  Absent UC:   regular, every 2 minutes SVE:   Dilation: 8 Effacement (%):  (Puffy) Station: 0 Exam by:: Dr Konrad Dolores  Labs: Lab Results  Component Value Date   WBC 7.4 06/10/2011   HGB 9.4* 06/10/2011   HCT 28.9* 06/10/2011   MCV 91.7 06/10/2011   PLT 230 06/10/2011    Assessment / Plan: Augmentation of labor, progressing well  Labor: Progressing normally, on Pit. IUCP placed to measure MVUs.  Pt to rotate sides frequently Fetal Wellbeing:  Category I Pain Control:  Epidural I/D:  n/a Anticipated MOD:  NSVD  [name] 06/10/2011, 4:38 PM

## 2011-06-10 NOTE — Progress Notes (Signed)
  Subjective: Patient resting comfortably.  Objective: BP 137/82  Pulse 75  Temp(Src) 98.3 F (36.8 C) (Oral)  Resp 18  Ht 5' 3.5" (1.613 m)  Wt 96.163 kg (212 lb)  BMI 36.97 kg/m2  SpO2 100% I/O last 3 completed shifts: In: -  Out: 200 [Urine:200]    FHT:  FHR: 120s bpm, variability: moderate,  accelerations:  Abscent,  decelerations:  Absent UC:   regular, every 1-2 minutes SVE:   Dilation: 4.5 Effacement (%): 70 Station: -3 Exam by:: Dr. Adrian Blackwater  Labs: Lab Results  Component Value Date   WBC 6.8 06/08/2011   HGB 8.9* 06/08/2011   HCT 27.6* 06/08/2011   MCV 92.3 06/08/2011   PLT 243 06/08/2011    Assessment / Plan: Continue with pitocin, expectant management.  Category 1 tracing.  Mary [name] JEHIEL 06/10/2011, 6:29 AM

## 2011-06-10 NOTE — Anesthesia Preprocedure Evaluation (Signed)
Anesthesia Evaluation  Patient identified by MRN, date of birth, ID band Patient awake    Reviewed: Allergy & Precautions, H&P , Patient's Chart, lab work & pertinent test results  Airway Mallampati: III TM Distance: >3 FB Neck ROM: full    Dental No notable dental hx.    Pulmonary neg pulmonary ROS,  breath sounds clear to auscultation  Pulmonary exam normal       Cardiovascular hypertension, negative cardio ROS  Rhythm:regular Rate:Normal     Neuro/Psych negative neurological ROS  negative psych ROS   GI/Hepatic negative GI ROS, Neg liver ROS,   Endo/Other  negative endocrine ROS  Renal/GU negative Renal ROS     Musculoskeletal   Abdominal   Peds  Hematology negative hematology ROS (+)   Anesthesia Other Findings   Reproductive/Obstetrics (+) Pregnancy                          Anesthesia Physical Anesthesia Plan  ASA: III  Anesthesia Plan: Epidural   Post-op Pain Management:    Induction:   Airway Management Planned:   Additional Equipment:   Intra-op Plan:   Post-operative Plan:   Informed Consent: I have reviewed the patients History and Physical, chart, labs and discussed the procedure including the risks, benefits and alternatives for the proposed anesthesia with the patient or authorized representative who has indicated his/her understanding and acceptance.     Plan Discussed with:   Anesthesia Plan Comments:        Anesthesia Quick Evaluation  

## 2011-06-10 NOTE — Progress Notes (Signed)
Mary [name] is a 21 y.o. G1P0000 at [redacted]w[redacted]d   Subjective: Pushing well x approx 45 mins now  Objective: BP 151/95  Pulse 114  Temp(Src) 98.4 F (36.9 C) (Oral)  Resp 20  Ht 5' 3.5" (1.613 m)  Wt 96.163 kg (212 lb)  BMI 36.97 kg/m2  SpO2 100%      FHT:  FHR: 150 bpm, variability: moderate,  accelerations:  Present,  decelerations:  Absentaccels 10x10 UC:   regular, every 3-4 minutes SVE:   Dilation: 10 Effacement (%): 100 Station: +1 Exam by:: Beatriz Stallion, RN seeing greater than half dollar-size of head with pushing; +2  Labs: Lab Results  Component Value Date   WBC 7.4 06/10/2011   HGB 9.4* 06/10/2011   HCT 28.9* 06/10/2011   MCV 91.7 06/10/2011   PLT 230 06/10/2011    Assessment / Plan: End 1st stage  Continue pushing with ctx Anticipate SVD  Mary [name] 06/10/2011, 7:15 PM

## 2011-06-11 ENCOUNTER — Encounter: Payer: Medicaid Other | Admitting: Physician Assistant

## 2011-06-11 NOTE — Progress Notes (Signed)
Post Partum Day 1 Subjective: no complaints, up ad lib, voiding, tolerating PO and + flatus  Objective: Blood pressure 122/80, pulse 109, temperature 98.1 F (36.7 C), temperature source Oral, resp. rate 24, height 5' 3.5" (1.613 m), weight 96.163 kg (212 lb), SpO2 100.00%, unknown if currently breastfeeding.  Physical Exam:  General: alert, cooperative and no distress Lochia: appropriate Uterine Fundus: firm DVT Evaluation: No evidence of DVT seen on physical exam. Calf/Ankle edema is present (no change since before delivery)   Basename 06/10/11 2125 06/10/11 1025  HGB 7.0* 9.4*  HCT 21.2* 28.9*    Assessment/Plan: Plan for discharge tomorrow, Lactation consult and Contraception OCP   LOS: 3 days   MERRELL, DAVID 06/11/2011, 8:07 AM    Evaluation and management procedures were performed by Resident physician under my supervision/collaboration. Chart reviewed, patient examined by me and I agree with management and plan.

## 2011-06-11 NOTE — Anesthesia Postprocedure Evaluation (Signed)
  Anesthesia Post-op Note  Patient: Mary [name]  Procedure(s) Performed: * No procedures listed *  Patient Location: Mother/Baby  Anesthesia Type: Epidural  Level of Consciousness: oriented  Airway and Oxygen Therapy: Patient Spontanous Breathing  Post-op Pain: mild Post-op Assessment: Patient's Cardiovascular Status Stable and Respiratory Function Stable  Post-op Vital Signs: stable  Complications: No apparent anesthesia complications

## 2011-06-11 NOTE — Progress Notes (Signed)
UR chart review completed.  

## 2011-06-12 MED ORDER — IBUPROFEN 600 MG PO TABS: 600.0000 mg | ORAL_TABLET | Freq: Four times a day (QID) | ORAL | Status: AC

## 2011-06-12 MED ORDER — SENNOSIDES-DOCUSATE SODIUM 8.6-50 MG PO TABS: 2.0000 | ORAL_TABLET | Freq: Every day | ORAL | Status: DC

## 2011-06-12 MED ORDER — OXYCODONE-ACETAMINOPHEN 5-325 MG PO TABS: 1.0000 | ORAL_TABLET | ORAL | Status: AC | PRN

## 2011-06-12 NOTE — Discharge Instructions (Signed)

## 2011-06-12 NOTE — Discharge Summary (Signed)
Obstetric Discharge Summary Reason for Admission: induction of labor due to [facility] Prenatal Procedures: none Intrapartum Procedures: spontaneous vaginal delivery Postpartum Procedures: none Complications-Operative and Postpartum: 2nd degree perineal laceration. Estimated blood loss 650cc Hemoglobin  Date Value Range Status  06/10/2011 7.0* 12.0-15.0 (g/dL) Final     DELTA CHECK NOTED     REPEATED TO VERIFY     HCT  Date Value Range Status  06/10/2011 21.2* 36.0-46.0 (%) Final    Physical Exam:  General: alert, cooperative and no distress Lochia: appropriate Uterine Fundus: firm DVT Evaluation: No evidence of DVT seen on physical exam. 2+ LE edema  Discharge Diagnoses: Term Pregnancy-delivered  Discharge Information: Date: 06/12/2011 Activity: unrestricted and pelvic rest Diet: routine Medications: Ibuprofen, Colace and Percocet Condition: stable Instructions: refer to practice specific booklet Discharge to: home   Newborn Data: Live born female  Birth Weight: 7 lb 14.5 oz (3585 g) APGAR: 9, 9  Home with mother.  MERRELL, DAVID 06/12/2011, 7:36 AM  I have seen this patient and agree with the above resident's note.  LEFTWICH-KIRBY, Akasha Melena Certified Nurse-Midwife

## 2011-06-26 ENCOUNTER — Encounter (HOSPITAL_COMMUNITY): Payer: Self-pay | Admitting: *Deleted

## 2011-06-26 ENCOUNTER — Inpatient Hospital Stay (HOSPITAL_COMMUNITY)
Admission: AD | Admit: 2011-06-26 | Discharge: 2011-06-26 | Disposition: A | Discharge status: Home / Self Care | Payer: Medicaid Other | Source: Ambulatory Visit | Attending: Obstetrics & Gynecology | Admitting: Obstetrics & Gynecology

## 2011-06-26 DIAGNOSIS — G44209 Tension-type headache, unspecified, not intractable: Secondary | ICD-10-CM | POA: Insufficient documentation

## 2011-06-26 DIAGNOSIS — R51 Headache: Secondary | ICD-10-CM

## 2011-06-26 DIAGNOSIS — O99893 Other specified diseases and conditions complicating puerperium: Secondary | ICD-10-CM | POA: Insufficient documentation

## 2011-06-26 LAB — COMPREHENSIVE METABOLIC PANEL
ALT: 14 U/L (ref 0–35)
AST: 17 U/L (ref 0–37)
Albumin: 3.9 g/dL (ref 3.5–5.2)
Alkaline Phosphatase: 120 U/L — ABNORMAL HIGH (ref 39–117)
Calcium: 9.9 mg/dL (ref 8.4–10.5)
Potassium: 4.2 mEq/L (ref 3.5–5.1)
Sodium: 136 mEq/L (ref 135–145)
Total Protein: 7.5 g/dL (ref 6.0–8.3)

## 2011-06-26 LAB — URINE MICROSCOPIC-ADD ON

## 2011-06-26 LAB — URINALYSIS, ROUTINE W REFLEX MICROSCOPIC
Bilirubin Urine: NEGATIVE
Glucose, UA: NEGATIVE mg/dL
Ketones, ur: NEGATIVE mg/dL
Protein, ur: NEGATIVE mg/dL

## 2011-06-26 LAB — CBC
HCT: 27.9 % — ABNORMAL LOW (ref 36.0–46.0)
Hemoglobin: 8.6 g/dL — ABNORMAL LOW (ref 12.0–15.0)
MCH: 27.9 pg (ref 26.0–34.0)
MCHC: 30.8 g/dL (ref 30.0–36.0)
MCV: 90.6 fL (ref 78.0–100.0)
RDW: 14.8 % (ref 11.5–15.5)

## 2011-06-26 MED ORDER — IBUPROFEN 600 MG PO TABS: 600.0000 mg | ORAL_TABLET | Freq: Four times a day (QID) | ORAL | Status: AC | PRN

## 2011-06-26 MED ORDER — ACETAMINOPHEN 325 MG PO TABS
650.0000 mg | ORAL_TABLET | Freq: Once | ORAL | Status: AC
  Administered 2011-06-26: 650 mg via ORAL
  Filled 2011-06-26: qty 2

## 2011-06-26 NOTE — MAU Note (Signed)
Vag del 04/02.  Home nurse was there today, BP was fine..  Was induced due to high BP.  HA started after she left, never had anything like this before. Baby is doing well, is breast feeding.  Baby has gained been seeing a lot of spots lately; denies epigastric pain, swelling is going down.

## 2011-06-26 NOTE — MAU Provider Note (Signed)
History     CSN: [id number]  Arrival date and time: 06/26/11 1613   First Provider Initiated Contact with Patient 06/26/11 1743      Chief Complaint  Patient presents with  . Headache   HPI 21yo G1P1 who is 2 weeks postpartum from a vaginal delivery.  Pregnancy complicated by gestational hypertension with negative pre-eclamptic workup.  She presents to MAU with throbbing HA that is 10 out of 10 that has been intermittent over past 4 days, but has become worse today.  Headache radiates down neck.  Laying down made headache worse today.  Has not tried anything to improve her headache.  Home nurse saw her today and her blood pressure was normal.  Lochia is normal.  Denies vaginal bleeding, vaginal discharge, abdominal pain, fevers, chills, slurred speech, muscle weakness.  Denies photo/phonophobia.    OB History    Grav Para Term Preterm Abortions TAB SAB Ect Mult Living   1 1 1  0 0 0 0 0 0 1      Past Medical History  Diagnosis Date  . Pregnancy induced hypertension 06-04-11    gestational hypertention    Past Surgical History  Procedure Date  . No past surgeries     Family History  Problem Relation Age of Onset  . Diabetes Father   . Diabetes Paternal Grandfather     History  Substance Use Topics  . Smoking status: Never Smoker   . Smokeless tobacco: Not on file  . Alcohol Use: No    Allergies: No Known Allergies  Prescriptions prior to admission  Medication Sig Dispense Refill  . Prenatal Vit-Fe Fumarate-FA (PRENATAL MULTIVITAMIN) TABS Take 1 tablet by mouth every morning.         ROS Physical Exam   Blood pressure 116/75, pulse 89, temperature 99.4 F (37.4 C), temperature source Oral, resp. rate 18, height 5\' 3"  (1.6 m), weight 81.194 kg (179 lb), currently breastfeeding.  Physical Exam  Constitutional: She is oriented to person, place, and time. She appears well-developed and well-nourished.  HENT:  Head: Normocephalic and atraumatic.  Eyes:  Conjunctivae are normal. Pupils are equal, round, and reactive to light.  GI: Soft. Bowel sounds are normal. She exhibits no distension and no mass. There is no tenderness. There is no rebound and no guarding.  Musculoskeletal: Normal range of motion. She exhibits no edema.  Neurological: She is alert and oriented to person, place, and time. No cranial nerve deficit. Coordination normal.       Brisk DTRs.  No clonus.  Skin: Skin is warm and dry. No erythema.  Psychiatric: She has a normal mood and affect. Her behavior is normal. Judgment and thought content normal.   Results for orders placed during the hospital encounter of 06/26/11 (from the past 24 hour(s))  COMPREHENSIVE METABOLIC PANEL     Status: Abnormal   Collection Time   06/26/11  5:00 PM      Component Value Range   Sodium 136  135 - 145 (mEq/L)   Potassium 4.2  3.5 - 5.1 (mEq/L)   Chloride 101  96 - 112 (mEq/L)   CO2 24  19 - 32 (mEq/L)   Glucose, Bld 89  70 - 99 (mg/dL)   BUN 14  6 - 23 (mg/dL)   Creatinine, Ser 7.82  0.50 - 1.10 (mg/dL)   Calcium 9.9  8.4 - 95.6 (mg/dL)   Total Protein 7.5  6.0 - 8.3 (g/dL)   Albumin 3.9  3.5 - 5.2 (g/dL)  AST 17  0 - 37 (U/L)   ALT 14  0 - 35 (U/L)   Alkaline Phosphatase 120 (*) 39 - 117 (U/L)   Total Bilirubin 0.4  0.3 - 1.2 (mg/dL)   GFR calc non Af Amer >90  >90 (mL/min)   GFR calc Af Amer >90  >90 (mL/min)  CBC     Status: Abnormal   Collection Time   06/26/11  5:00 PM      Component Value Range   WBC 5.8  4.0 - 10.5 (K/uL)   RBC 3.08 (*) 3.87 - 5.11 (MIL/uL)   Hemoglobin 8.6 (*) 12.0 - 15.0 (g/dL)   HCT 16.1 (*) 09.6 - 46.0 (%)   MCV 90.6  78.0 - 100.0 (fL)   MCH 27.9  26.0 - 34.0 (pg)   MCHC 30.8  30.0 - 36.0 (g/dL)   RDW 04.5  40.9 - 81.1 (%)   Platelets 611 (*) 150 - 400 (K/uL)  URINALYSIS, ROUTINE W REFLEX MICROSCOPIC     Status: Abnormal   Collection Time   06/26/11  6:16 PM      Component Value Range   Color, Urine YELLOW  YELLOW    APPearance CLEAR  CLEAR     Specific Gravity, Urine 1.015  1.005 - 1.030    pH 5.5  5.0 - 8.0    Glucose, UA NEGATIVE  NEGATIVE (mg/dL)   Hgb urine dipstick LARGE (*) NEGATIVE    Bilirubin Urine NEGATIVE  NEGATIVE    Ketones, ur NEGATIVE  NEGATIVE (mg/dL)   Protein, ur NEGATIVE  NEGATIVE (mg/dL)   Urobilinogen, UA 0.2  0.0 - 1.0 (mg/dL)   Nitrite NEGATIVE  NEGATIVE    Leukocytes, UA SMALL (*) NEGATIVE   URINE MICROSCOPIC-ADD ON     Status: Abnormal   Collection Time   06/26/11  6:16 PM      Component Value Range   Squamous Epithelial / LPF FEW (*) RARE    WBC, UA 0-2  <3 (WBC/hpf)   RBC / HPF 0-2  <3 (RBC/hpf)   Bacteria, UA FEW (*) RARE      MAU Course  Procedures  Assessment and Plan  1.  Tension headaches Patient having normal labs and blood pressure, less likely to be postpartum pre-eclampsia.  Headache improved with tylenol - likely tension type headaches.  Will d/c patient to home.  Counseled patient to return if headaches persist without improvement with ibuprofen or tylenol.  [facility].  Candelaria Celeste JEHIEL 06/26/2011, 6:41 PM

## 2011-06-26 NOTE — Discharge Instructions (Signed)

## 2011-07-09 ENCOUNTER — Ambulatory Visit: Payer: Medicaid Other | Admitting: Physician Assistant

## 2011-12-28 ENCOUNTER — Emergency Department (HOSPITAL_COMMUNITY): Payer: Medicaid Other

## 2011-12-28 ENCOUNTER — Encounter (HOSPITAL_COMMUNITY): Payer: Self-pay | Admitting: *Deleted

## 2011-12-28 ENCOUNTER — Emergency Department (HOSPITAL_COMMUNITY)
Admission: EM | Admit: 2011-12-28 | Discharge: 2011-12-29 | Disposition: A | Discharge status: Home / Self Care | Payer: Medicaid Other | Attending: Emergency Medicine | Admitting: Emergency Medicine

## 2011-12-28 DIAGNOSIS — O2690 Pregnancy related conditions, unspecified, unspecified trimester: Secondary | ICD-10-CM | POA: Insufficient documentation

## 2011-12-28 DIAGNOSIS — Z3490 Encounter for supervision of normal pregnancy, unspecified, unspecified trimester: Secondary | ICD-10-CM

## 2011-12-28 LAB — POCT PREGNANCY, URINE: Preg Test, Ur: POSITIVE — AB

## 2011-12-28 NOTE — ED Provider Notes (Signed)
History     CSN: [id number]  Arrival date & time 12/28/11  2222   First MD Initiated Contact with Patient 12/28/11 2306      Chief Complaint  Patient presents with  . Abdominal Pain    (Consider location/radiation/quality/duration/timing/severity/associated sxs/prior treatment) HPI Comments: 21 y/o female presents to the ED with sudden onset abdominal pain while sitting in the car about 2 hours ago. Pain began after yawning. Only present with yawning and sneezing. Describes the pain as more of a "pulling" feeling in her lower abdomen. Denies nausea or vomiting. No bowel changes. Denies vaginal bleeding, itching or discharge. Does not believe she could be pregnant. LMP sometime in September. No back pain. She was feeling fine prior to onset of the "pulling" feeling.  The history is provided by the patient.    Past Medical History  Diagnosis Date  . Pregnancy induced hypertension 06-04-11    gestational hypertention    Past Surgical History  Procedure Date  . No past surgeries     Family History  Problem Relation Age of Onset  . Diabetes Father   . Diabetes Paternal Grandfather     History  Substance Use Topics  . Smoking status: Never Smoker   . Smokeless tobacco: Not on file  . Alcohol Use: No    OB History    Grav Para Term Preterm Abortions TAB SAB Ect Mult Living   1 1 1  0 0 0 0 0 0 1      Review of Systems  Constitutional: Negative for fever, chills and appetite change.  HENT: Negative for neck pain and neck stiffness.   Respiratory: Negative for shortness of breath.   Cardiovascular: Negative for chest pain.  Gastrointestinal: Positive for abdominal pain. Negative for nausea, vomiting, diarrhea and constipation.  Genitourinary: Negative for dysuria, frequency, vaginal bleeding, vaginal discharge, difficulty urinating, vaginal pain, menstrual problem and pelvic pain.  Musculoskeletal: Negative for back pain.  Skin: Negative for pallor.  Neurological:  Negative for dizziness and light-headedness.  Psychiatric/Behavioral: The patient is not nervous/anxious.     Allergies  Review of patient's allergies indicates no known allergies.  Home Medications   Current Outpatient Rx  Name Route Sig Dispense Refill  . PRENATAL MULTIVITAMIN CH Oral Take 1 tablet by mouth every morning.       BP 135/91  Pulse 100  Temp 98.1 F (36.7 C) (Oral)  Resp 16  SpO2 100%  Breastfeeding? Yes  Physical Exam  Nursing note and vitals reviewed. Constitutional: She is oriented to person, place, and time. She appears well-developed and well-nourished. No distress.  HENT:  Head: Normocephalic and atraumatic.  Eyes: Conjunctivae normal and EOM are normal.  Neck: Normal range of motion. Neck supple.  Cardiovascular: Regular rhythm and normal heart sounds.  Tachycardia present.   Pulmonary/Chest: Effort normal and breath sounds normal.  Abdominal: Soft. Normal appearance and bowel sounds are normal. She exhibits no distension and no mass. There is no tenderness. There is no CVA tenderness.  Genitourinary: Uterus normal. Cervix exhibits discharge (white). Cervix exhibits no motion tenderness and no friability. Right adnexum displays no mass, no tenderness and no fullness. Left adnexum displays no mass, no tenderness and no fullness. There is tenderness around the vagina. No erythema or bleeding around the vagina. Vaginal discharge (white) found.  Musculoskeletal: Normal range of motion. She exhibits no edema.  Neurological: She is alert and oriented to person, place, and time.  Skin: Skin is warm and dry.  Psychiatric: She has  a normal mood and affect. Her behavior is normal.    ED Course  Procedures (including critical care time)  Labs Reviewed  POCT PREGNANCY, URINE - Abnormal; Notable for the following:    Preg Test, Ur POSITIVE (*)     All other components within normal limits  HCG, QUANTITATIVE, PREGNANCY - Abnormal; Notable for the following:     hCG, Beta Chain, Quant, S 4836 (*)     All other components within normal limits  WET PREP, GENITAL  URINALYSIS, ROUTINE W REFLEX MICROSCOPIC  GC/CHLAMYDIA PROBE AMP, GENITAL   US Ob Comp Less 14 Wks  12/29/2011  *RADIOLOGY REPORT*  Clinical Data: Pelvic pain and positive urine pregnancy test.  OBSTETRIC <14 WK [place] AND TRANSVAGINAL OB US  Technique:  Both transabdominal and transvaginal ultrasound examinations were performed for complete evaluation of the gestation as well as the maternal uterus, adnexal regions, and pelvic cul-de-sac.  Transvaginal technique was performed to assess early pregnancy.  Comparison:  04/15/2011  Intrauterine gestational sac:  Single intrauterine gestational sac.  Yolk Sac: None  Embryo: None  MSD: 6.5 mm =  5 w 3 d and EDC is 08/26/2012  Maternal uterus/adnexae: There is evidence for an intrauterine gestational sac along the left side of the fundus.  Right ovary measures 2.8 x 2.7 x 2.1 cm. There is an oval shaped hypoechoic structure in the right ovary that measures up to 1.6 cm and suggestive for a dominant follicle. Left ovary measures 1.4 x 2.3 x 1.3 cm with multiple small follicles.  Trace amount of fluid around the uterus.  IMPRESSION: Evidence for an intrauterine gestational sac.  Estimated gestational age is 5 weeks and 3 days.   Original Report Authenticated By: Richarda Overlie, M.D.    US Ob Transvaginal  12/29/2011  *RADIOLOGY REPORT*  Clinical Data: Pelvic pain and positive urine pregnancy test.  OBSTETRIC <14 WK [place] AND TRANSVAGINAL OB US  Technique:  Both transabdominal and transvaginal ultrasound examinations were performed for complete evaluation of the gestation as well as the maternal uterus, adnexal regions, and pelvic cul-de-sac.  Transvaginal technique was performed to assess early pregnancy.  Comparison:  04/15/2011  Intrauterine gestational sac:  Single intrauterine gestational sac.  Yolk Sac: None  Embryo: None  MSD: 6.5 mm =  5 w 3 d and EDC is 08/26/2012   Maternal uterus/adnexae: There is evidence for an intrauterine gestational sac along the left side of the fundus.  Right ovary measures 2.8 x 2.7 x 2.1 cm. There is an oval shaped hypoechoic structure in the right ovary that measures up to 1.6 cm and suggestive for a dominant follicle. Left ovary measures 1.4 x 2.3 x 1.3 cm with multiple small follicles.  Trace amount of fluid around the uterus.  IMPRESSION: Evidence for an intrauterine gestational sac.  Estimated gestational age is 5 weeks and 3 days.   Original Report Authenticated By: Richarda Overlie, M.D.      1. Pregnancy       MDM  21 y/o female with abdominal "pulling" while yawning or sneezing. No pain present at this time. Urine pregnancy positive. No tenderness or CMT on pelvic exam. White discharge present, not copious or malodorous. Wet prep without any infection or yeast. Ultrasound showing 5 week 2 day gestational sac. Patient states her understanding of plan.         Trevor Mace, PA-C 12/29/11 [phone]

## 2011-12-28 NOTE — ED Notes (Addendum)
Pt states irregular bowel movements in the past. Pt states she was in the car and when she went to sneeze she had a sharp pain in her abdomen. Pt states that she took a latex, then when she went to yawn she also had a sharp pain in her abdomen. Pt states that she feels "gas" movement in her abdomen at times.

## 2011-12-28 NOTE — ED Notes (Signed)
Notified Vinie Sill of positive preg test

## 2011-12-29 LAB — URINE MICROSCOPIC-ADD ON

## 2011-12-29 LAB — GC/CHLAMYDIA PROBE AMP, GENITAL
Chlamydia, DNA Probe: NEGATIVE
GC Probe Amp, Genital: NEGATIVE

## 2011-12-29 LAB — URINALYSIS, ROUTINE W REFLEX MICROSCOPIC
Ketones, ur: NEGATIVE mg/dL
Nitrite: NEGATIVE
pH: 6 (ref 5.0–8.0)

## 2011-12-29 LAB — WET PREP, GENITAL: Yeast Wet Prep HPF POC: NONE SEEN

## 2011-12-29 MED ORDER — PRENATAL COMPLETE 14-0.4 MG PO TABS: 1.0000 | ORAL_TABLET | Freq: Every day | ORAL | Status: DC

## 2011-12-29 NOTE — ED Provider Notes (Signed)
Medical screening examination/treatment/procedure(s) were performed by non-physician practitioner and as supervising physician I was immediately available for consultation/collaboration.  Pookela Sellin M Mykira Hofmeister, MD 12/29/11 0512 

## 2012-01-07 ENCOUNTER — Inpatient Hospital Stay (HOSPITAL_COMMUNITY)
Admission: AD | Admit: 2012-01-07 | Discharge: 2012-01-07 | Disposition: A | Discharge status: Home / Self Care | Payer: Medicaid Other | Source: Ambulatory Visit | Attending: Obstetrics & Gynecology | Admitting: Obstetrics & Gynecology

## 2012-01-07 ENCOUNTER — Inpatient Hospital Stay (HOSPITAL_COMMUNITY): Payer: Medicaid Other

## 2012-01-07 ENCOUNTER — Encounter (HOSPITAL_COMMUNITY): Payer: Self-pay

## 2012-01-07 DIAGNOSIS — O99891 Other specified diseases and conditions complicating pregnancy: Secondary | ICD-10-CM | POA: Insufficient documentation

## 2012-01-07 DIAGNOSIS — R109 Unspecified abdominal pain: Secondary | ICD-10-CM | POA: Insufficient documentation

## 2012-01-07 DIAGNOSIS — O139 Gestational [pregnancy-induced] hypertension without significant proteinuria, unspecified trimester: Secondary | ICD-10-CM | POA: Insufficient documentation

## 2012-01-07 DIAGNOSIS — Z3490 Encounter for supervision of normal pregnancy, unspecified, unspecified trimester: Secondary | ICD-10-CM

## 2012-01-07 DIAGNOSIS — O210 Mild hyperemesis gravidarum: Secondary | ICD-10-CM | POA: Insufficient documentation

## 2012-01-07 LAB — URINALYSIS, ROUTINE W REFLEX MICROSCOPIC
Bilirubin Urine: NEGATIVE
Hgb urine dipstick: NEGATIVE
Protein, ur: NEGATIVE mg/dL
Urobilinogen, UA: 0.2 mg/dL (ref 0.0–1.0)

## 2012-01-07 MED ORDER — ONDANSETRON 8 MG PO TBDP
8.0000 mg | ORAL_TABLET | Freq: Once | ORAL | Status: AC
  Administered 2012-01-07: 8 mg via ORAL
  Filled 2012-01-07: qty 1

## 2012-01-07 MED ORDER — ONDANSETRON 8 MG PO TBDP: 8.0000 mg | ORAL_TABLET | Freq: Three times a day (TID) | ORAL | Status: DC | PRN

## 2012-01-07 MED ORDER — PRENATAL VITAMINS PLUS 27-1 MG PO TABS: 1.0000 | ORAL_TABLET | Freq: Every day | ORAL | Status: DC

## 2012-01-07 NOTE — MAU Note (Signed)
Patient states she has been having abdominal pain for a while and was seen on 10-21 and had a positive pregnancy test and ultrasound. Continues to have nausea, some vomiting. Denies any bleeding or vaginal discharge.

## 2012-01-07 NOTE — MAU Provider Note (Signed)
Attestation of Attending Supervision of Advanced Practitioner (CNM/NP): Evaluation and management procedures were performed by the Advanced Practitioner under my supervision and collaboration.  I have reviewed the Advanced Practitioner's note and chart, and I agree with the management and plan.  Clay Solum, MD, FACOG Attending Obstetrician & Gynecologist Faculty Practice, [facility]   

## 2012-01-07 NOTE — MAU Provider Note (Signed)
History     CSN: [id number]  Arrival date and time: 01/07/12 4098   First Provider Initiated Contact with Patient 01/07/12 1032      Chief Complaint  Patient presents with  . Abdominal Pain   HPI Mary [name] is a 21 y.o. female G2P1001 at [redacted]w[redacted]d who presents today complaining of abdominal pain, nausea, and vomiting. [name] found out she was pregnant approximately 10 days ago and was very surprised. She states that she feels very different with this pregnancy than her previous pregnancy (she delivered a healthy girl in April 2013). [name] reports 5-week history of N/V. Onset of abdominal pain approximately 4 days ago. She describes constant nausea with approximately 1 daily episode of vomiting. States that she constantly feels like she needs to throw up but usually can keep her food down. Recent diet has consisted of barbecue chips, spicy sausage, applesauce, candy, and other foods. Describes vague lower abdominal pain x 4 days that increased in severity today. She describes the abdominal pain as occasionally sharp and throbbing, in her lower abdomen bilaterally with occasional epigastric and midsternal radiation. Other times pain is constant 5/10 in severity. Sitting upright provides occasional relief. Pt has not taken any medication for the pain, nausea, or vomiting. Denies fever, chills, vaginal bleeding, increased vaginal discharge, back pain, dizziness, lightheadedness, extremity edema, chest pain, or palpitations. Pt had ultrasound performed on 12/28/11 that revealed intrauterine gestational sac. BHCG quant performed on 10/20 was 4836. Pt plans to see [facility] for prenatal care. Currently taking women's daily multivitamin, plans to find a prenatal vitamin to take.    Past Medical History  Diagnosis Date  . Pregnancy induced hypertension 06-04-11    gestational hypertention    Past Surgical History  Procedure Date  . No past surgeries     Family History  Problem Relation  Age of Onset  . Diabetes Father   . Diabetes Paternal Grandfather     History  Substance Use Topics  . Smoking status: Never Smoker   . Smokeless tobacco: Never Used  . Alcohol Use: No    Allergies: No Known Allergies  Prescriptions prior to admission  Medication Sig Dispense Refill  . Multiple Vitamin (MULTIVITAMIN WITH MINERALS) TABS Take 1 tablet by mouth daily.        Review of Systems  Constitutional: Negative for fever and chills.  Eyes: Negative for blurred vision and double vision.  Respiratory: Positive for shortness of breath (pt reports feeling out of breath when she speaks since experiencing this abdominal pain).   Cardiovascular: Negative for chest pain, palpitations and leg swelling.  Gastrointestinal: Positive for heartburn, nausea, vomiting and abdominal pain (RLQ and LLQ with occasional epigastric radiation). Negative for diarrhea and constipation.  Genitourinary: Negative for dysuria, urgency and frequency.  Musculoskeletal: Negative for back pain.  Neurological: Positive for headaches (pt reports intermittent headaches, varying in severity, that have been present for several weeks). Negative for dizziness (no dizziness or lightheadedness).   Physical Exam   Blood pressure 123/75, pulse 83, temperature 98.4 F (36.9 C), temperature source Oral, resp. rate 16, height 5' 2.5" (1.588 m), weight 88.633 kg (195 lb 6.4 oz), SpO2 100.00%.   Physical Exam  Constitutional: She is oriented to person, place, and time. She appears well-developed and well-nourished. She appears distressed (in mild distress due to pain. Able to speak in full sentences without any obvious respiratory distress).  HENT:  Head: Normocephalic and atraumatic.  Cardiovascular: Normal rate, regular rhythm, normal heart sounds  and intact distal pulses.   Respiratory: Effort normal and breath sounds normal. No respiratory distress. She has no wheezes.  GI: Soft. She exhibits no mass. There is  tenderness (RLQ with radiation to RUQ/epigastric region). There is no rebound and no guarding.       Hypoactive bowel sounds. Psoas sign negative, Heel jar negative. Questionable CVA tenderness  Musculoskeletal: She exhibits no edema.  Neurological: She is alert and oriented to person, place, and time.  Skin: Skin is warm and dry.    MAU Course  Procedures  Results for orders placed during the hospital encounter of 01/07/12 (from the past 24 hour(s))  URINALYSIS, ROUTINE W REFLEX MICROSCOPIC     Status: Normal   Collection Time   01/07/12 11:07 AM      Component Value Range   Color, Urine YELLOW  YELLOW   APPearance CLEAR  CLEAR   Specific Gravity, Urine 1.020  1.005 - 1.030   pH 6.0  5.0 - 8.0   Glucose, UA NEGATIVE  NEGATIVE mg/dL   Hgb urine dipstick NEGATIVE  NEGATIVE   Bilirubin Urine NEGATIVE  NEGATIVE   Ketones, ur NEGATIVE  NEGATIVE mg/dL   Protein, ur NEGATIVE  NEGATIVE mg/dL   Urobilinogen, UA 0.2  0.0 - 1.0 mg/dL   Nitrite NEGATIVE  NEGATIVE   Leukocytes, UA NEGATIVE  NEGATIVE      MDM N/V, abdominal pain. No vaginal bleeding. G2P1001 with no history of ectopic pregnancy or miscarriages. Previous ultrasound revealed intrauterine gestational sac and appropriate bHCG. Today's [place] consistent with normal pregnancy. UA negative. No concern for other complications at this point, plan to treat pain/nausea.  Assessment and Plan   [name] is a 21 y.o. female G2P1001 at [redacted]w[redacted]d who presents today complaining of abdominal pain, nausea, and vomiting.   1. Abdominal Pain - diff dx: likely physiologic vs. Likely GERD vs. Possible infectious/UTI vs. Unlikely ectopic - afebrile, mild distress w/ abd exam +lower abd pain,  w/o concern of rebound/guarding, peritoneal signs - hx prior US 10/20 w/ intrauterine growth sac. Plan to repeat transvaginal US - UA negative. Questionable CVAT, decreased UOP. Continue hydration - advised Tylenol for pain  2. Nausea/Vomiting -  stable. 1 vomiting episode today (about 1x daily), no concern for hyperemesis - likely 2/2 dietary habits (fatty, spicy foods). Discussed temporarily eating BRAT diet, small frequent meals to help her nausea - Zofran for nausea. Recommend OTC nausea relief w/ Vitamin B6, Unisom, or Ginger supplements  3. Pregnancy - [redacted]w[redacted]d - G2P1 w/ hx gestational HTN - transvaginal US (01/07/12) - showed [redacted]w[redacted]d intrauterine pregnancy w/ visible gestational sac, yolk sac - will f/u with pre-natal care [facility]. Advised pt to start prenatal MVI  Evaluation and management procedures were performed by the under my supervision and collaboration. I have seen the client, reviewed the note and chart, and I agree with the management and plan.  Nolene Bernheim, RN, FNP   Harriet Masson 01/07/2012, 12:38 PM

## 2012-01-25 ENCOUNTER — Encounter (HOSPITAL_COMMUNITY): Payer: Self-pay | Admitting: Obstetrics and Gynecology

## 2012-01-25 ENCOUNTER — Inpatient Hospital Stay (HOSPITAL_COMMUNITY)
Admission: AD | Admit: 2012-01-25 | Discharge: 2012-01-25 | Disposition: A | Discharge status: Home / Self Care | Payer: Medicaid Other | Source: Ambulatory Visit | Attending: Obstetrics and Gynecology | Admitting: Obstetrics and Gynecology

## 2012-01-25 DIAGNOSIS — R109 Unspecified abdominal pain: Secondary | ICD-10-CM

## 2012-01-25 DIAGNOSIS — R1031 Right lower quadrant pain: Secondary | ICD-10-CM | POA: Insufficient documentation

## 2012-01-25 DIAGNOSIS — R1032 Left lower quadrant pain: Secondary | ICD-10-CM | POA: Insufficient documentation

## 2012-01-25 DIAGNOSIS — O99891 Other specified diseases and conditions complicating pregnancy: Secondary | ICD-10-CM | POA: Insufficient documentation

## 2012-01-25 DIAGNOSIS — O26899 Other specified pregnancy related conditions, unspecified trimester: Secondary | ICD-10-CM

## 2012-01-25 LAB — URINALYSIS, ROUTINE W REFLEX MICROSCOPIC
Bilirubin Urine: NEGATIVE
Glucose, UA: NEGATIVE mg/dL
Ketones, ur: NEGATIVE mg/dL
Leukocytes, UA: NEGATIVE
Protein, ur: NEGATIVE mg/dL

## 2012-01-25 NOTE — MAU Provider Note (Signed)
Attestation of Attending Supervision of Advanced Practitioner: Evaluation and management procedures were performed by the PA/NP/CNM/OB Fellow under my supervision/collaboration. Chart reviewed and agree with management and plan.  Adolf Ormiston V 01/25/2012 10:36 PM

## 2012-01-25 NOTE — MAU Provider Note (Signed)
  History     CSN: [id number]  Arrival date and time: 01/25/12 1621   First Provider Initiated Contact with Patient 01/25/12 1742      Chief Complaint  Patient presents with  . Abdominal Pain   HPIQuahneesha Q [name] is a 21 y.o. G2P1001 at [redacted]w[redacted]d.  She presents with c/o sharp LLQ abd pain that radiated across abd to RLQ x 10-15 min earlier today.  No bleeding or spotting, no change in discharge, UTI S&S. Has had some constipation, BM yesterday, nl. MAU visit 1`0/30 confirmed viable IUP, cultures and wet prep neg.    Past Medical History  Diagnosis Date  . Pregnancy induced hypertension 06-04-11    gestational hypertention    Past Surgical History  Procedure Date  . No past surgeries     Family History  Problem Relation Age of Onset  . Diabetes Father   . Diabetes Paternal Grandfather     History  Substance Use Topics  . Smoking status: Never Smoker   . Smokeless tobacco: Never Used  . Alcohol Use: No    Allergies: No Known Allergies  Prescriptions prior to admission  Medication Sig Dispense Refill  . ondansetron (ZOFRAN ODT) 8 MG disintegrating tablet Take 1 tablet (8 mg total) by mouth every 8 (eight) hours as needed for nausea.  20 tablet  0  . Prenatal Vit-Fe Fumarate-FA (PRENATAL VITAMINS PLUS) 27-1 MG TABS Take 1 tablet by mouth daily. Generic is OK - Prenatal vitamin with 1 mg Folic acid  30 tablet  0    Review of Systems  Constitutional: Negative for fever and chills.  Gastrointestinal: Positive for abdominal pain and constipation. Negative for heartburn, nausea, vomiting and diarrhea.  Genitourinary: Positive for frequency. Negative for dysuria and urgency.   Physical Exam   Blood pressure 119/64, pulse 82, temperature 98.3 F (36.8 C), temperature source Oral, resp. rate 18, height 5' 3.5" (1.613 m), weight 194 lb 6.4 oz (88.179 kg).  Physical Exam  Constitutional: She is oriented to person, place, and time. She appears well-developed and  well-nourished.  GI: Soft. She exhibits no distension and no mass. There is tenderness. There is no rebound and no guarding.       Informal ultrasound- viable IUP  Musculoskeletal: Normal range of motion.  Neurological: She is alert and oriented to person, place, and time.  Skin: Skin is warm and dry.  Psychiatric: She has a normal mood and affect. Her behavior is normal.    MAU Course  Procedures  MDM Viable IUP Abd pain- gas  Assessment and Plan  OTC tx reviewed Encouraged to make an appt for prenatal care.   Mary Keto M. 01/25/2012, 5:56 PM

## 2012-01-25 NOTE — MAU Note (Signed)
Pt reports she started having sharp pains in her abd today. Not having them right now. . Denies andy vaginal bleeding having some clear/green vaginal discharge no odor.

## 2012-03-10 NOTE — L&D Delivery Note (Signed)
Delivery Note At 3:32 AM a viable female was delivered via Vaginal, Spontaneous Delivery (Presentation: ;  ).  APGAR: 9, 9; weight 8 lb 6 oz (3799 g).   Placenta status: Intact, Spontaneous.  Cord: 3 vessels with the following complications: None.  Cord pH: n/a  Anesthesia: Local  Episiotomy: None Lacerations: 1st degree Suture Repair: 3.0 monocryl Est. Blood Loss (mL): 300  Mom to postpartum.  Baby to nursery-stable.  Napoleon Form 08/31/2012, 3:59 AM

## 2012-03-31 ENCOUNTER — Encounter: Payer: Self-pay | Admitting: Family Medicine

## 2012-03-31 ENCOUNTER — Ambulatory Visit (INDEPENDENT_AMBULATORY_CARE_PROVIDER_SITE_OTHER): Payer: Medicaid Other | Admitting: Family Medicine

## 2012-03-31 ENCOUNTER — Other Ambulatory Visit (HOSPITAL_COMMUNITY)
Admission: RE | Admit: 2012-03-31 | Discharge: 2012-03-31 | Disposition: A | Discharge status: Home / Self Care | Payer: Medicaid Other | Source: Ambulatory Visit | Attending: Family Medicine | Admitting: Family Medicine

## 2012-03-31 ENCOUNTER — Ambulatory Visit (HOSPITAL_COMMUNITY)
Admission: RE | Admit: 2012-03-31 | Discharge: 2012-03-31 | Disposition: A | Discharge status: Home / Self Care | Payer: Medicaid Other | Source: Ambulatory Visit | Attending: Family Medicine | Admitting: Family Medicine

## 2012-03-31 ENCOUNTER — Encounter: Payer: Self-pay | Admitting: *Deleted

## 2012-03-31 VITALS — BP 109/74 | Temp 98.2°F | Wt 185.3 lb

## 2012-03-31 DIAGNOSIS — Z3480 Encounter for supervision of other normal pregnancy, unspecified trimester: Secondary | ICD-10-CM

## 2012-03-31 DIAGNOSIS — Z113 Encounter for screening for infections with a predominantly sexual mode of transmission: Secondary | ICD-10-CM | POA: Insufficient documentation

## 2012-03-31 DIAGNOSIS — O09299 Supervision of pregnancy with other poor reproductive or obstetric history, unspecified trimester: Secondary | ICD-10-CM | POA: Insufficient documentation

## 2012-03-31 DIAGNOSIS — Z01419 Encounter for gynecological examination (general) (routine) without abnormal findings: Secondary | ICD-10-CM | POA: Insufficient documentation

## 2012-03-31 DIAGNOSIS — Z1389 Encounter for screening for other disorder: Secondary | ICD-10-CM | POA: Insufficient documentation

## 2012-03-31 DIAGNOSIS — Z363 Encounter for antenatal screening for malformations: Secondary | ICD-10-CM | POA: Insufficient documentation

## 2012-03-31 DIAGNOSIS — O358XX0 Maternal care for other (suspected) fetal abnormality and damage, not applicable or unspecified: Secondary | ICD-10-CM | POA: Insufficient documentation

## 2012-03-31 LAB — POCT URINALYSIS DIP (DEVICE)
Hgb urine dipstick: NEGATIVE
Protein, ur: 30 mg/dL — AB
Specific Gravity, Urine: >=1.03 (ref 1.005–1.030)
Urobilinogen, UA: 1 mg/dL (ref 0.0–1.0)
pH: 6 (ref 5.0–8.0)

## 2012-03-31 NOTE — Patient Instructions (Addendum)
Pregnancy - Second Trimester The second trimester of pregnancy (3 to 6 months) is a period of rapid growth for you and your baby. At the end of the sixth month, your baby is about 9 inches long and weighs 1 1/2 pounds. You will begin to feel the baby move between 18 and 20 weeks of the pregnancy. This is called quickening. Weight gain is faster. A clear fluid (colostrum) may leak out of your breasts. You may feel small contractions of the womb (uterus). This is known as false labor or Braxton-Hicks contractions. This is like a practice for labor when the baby is ready to be born. Usually, the problems with morning sickness have usually passed by the end of your first trimester. Some women develop small dark blotches (called cholasma, mask of pregnancy) on their face that usually goes away after the baby is born. Exposure to the sun makes the blotches worse. Acne may also develop in some pregnant women and pregnant women who have acne, may find that it goes away. PRENATAL EXAMS  Blood work may continue to be done during prenatal exams. These tests are done to check on your health and the probable health of your baby. Blood work is used to follow your blood levels (hemoglobin). Anemia (low hemoglobin) is common during pregnancy. Iron and vitamins are given to help prevent this. You will also be checked for diabetes between 24 and 28 weeks of the pregnancy. Some of the previous blood tests may be repeated.  The size of the uterus is measured during each visit. This is to make sure that the baby is continuing to grow properly according to the dates of the pregnancy.  Your blood pressure is checked every prenatal visit. This is to make sure you are not getting toxemia.  Your urine is checked to make sure you do not have an infection, diabetes or protein in the urine.  Your weight is checked often to make sure gains are happening at the suggested rate. This is to ensure that both you and your baby are growing  normally.  Sometimes, an ultrasound is performed to confirm the proper growth and development of the baby. This is a test which bounces harmless sound waves off the baby so your caregiver can more accurately determine due dates. Sometimes, a specialized test is done on the amniotic fluid surrounding the baby. This test is called an amniocentesis. The amniotic fluid is obtained by sticking a needle into the belly (abdomen). This is done to check the chromosomes in instances where there is a concern about possible genetic problems with the baby. It is also sometimes done near the end of pregnancy if an early delivery is required. In this case, it is done to help make sure the baby's lungs are mature enough for the baby to live outside of the womb. CHANGES OCCURING IN THE SECOND TRIMESTER OF PREGNANCY Your body goes through many changes during pregnancy. They vary from person to person. Talk to your caregiver about changes you notice that you are concerned about.  During the second trimester, you will likely have an increase in your appetite. It is normal to have cravings for certain foods. This varies from person to person and pregnancy to pregnancy.  Your lower abdomen will begin to bulge.  You may have to urinate more often because the uterus and baby are pressing on your bladder. It is also common to get more bladder infections during pregnancy (pain with urination). You can help this by   drinking lots of fluids and emptying your bladder before and after intercourse.  You may begin to get stretch marks on your hips, abdomen, and breasts. These are normal changes in the body during pregnancy. There are no exercises or medications to take that prevent this change.  You may begin to develop swollen and bulging veins (varicose veins) in your legs. Wearing support hose, elevating your feet for 15 minutes, 3 to 4 times a day and limiting salt in your diet helps lessen the problem.  Heartburn may develop  as the uterus grows and pushes up against the stomach. Antacids recommended by your caregiver helps with this problem. Also, eating smaller meals 4 to 5 times a day helps.  Constipation can be treated with a stool softener or adding bulk to your diet. Drinking lots of fluids, vegetables, fruits, and whole grains are helpful.  Exercising is also helpful. If you have been very active up until your pregnancy, most of these activities can be continued during your pregnancy. If you have been less active, it is helpful to start an exercise program such as walking.  Hemorrhoids (varicose veins in the rectum) may develop at the end of the second trimester. Warm sitz baths and hemorrhoid cream recommended by your caregiver helps hemorrhoid problems.  Backaches may develop during this time of your pregnancy. Avoid heavy lifting, wear low heal shoes and practice good posture to help with backache problems.  Some pregnant women develop tingling and numbness of their hand and fingers because of swelling and tightening of ligaments in the wrist (carpel tunnel syndrome). This goes away after the baby is born.  As your breasts enlarge, you may have to get a bigger bra. Get a comfortable, cotton, support bra. Do not get a nursing bra until the last month of the pregnancy if you will be nursing the baby.  You may get a dark line from your belly button to the pubic area called the linea nigra.  You may develop rosy cheeks because of increase blood flow to the face.  You may develop spider looking lines of the face, neck, arms and chest. These go away after the baby is born. HOME CARE INSTRUCTIONS   It is extremely important to avoid all smoking, herbs, alcohol, and unprescribed drugs during your pregnancy. These chemicals affect the formation and growth of the baby. Avoid these chemicals throughout the pregnancy to ensure the delivery of a healthy infant.  Most of your home care instructions are the same as  suggested for the first trimester of your pregnancy. Keep your caregiver's appointments. Follow your caregiver's instructions regarding medication use, exercise and diet.  During pregnancy, you are providing food for you and your baby. Continue to eat regular, well-balanced meals. Choose foods such as meat, fish, milk and other low fat dairy products, vegetables, fruits, and whole-grain breads and cereals. Your caregiver will tell you of the ideal weight gain.  A physical sexual relationship may be continued up until near the end of pregnancy if there are no other problems. Problems could include early (premature) leaking of amniotic fluid from the membranes, vaginal bleeding, abdominal pain, or other medical or pregnancy problems.  Exercise regularly if there are no restrictions. Check with your caregiver if you are unsure of the safety of some of your exercises. The greatest weight gain will occur in the last 2 trimesters of pregnancy. Exercise will help you:  Control your weight.  Get you in shape for labor and delivery.  Lose weight   after you have the baby.  Wear a good support or jogging bra for breast tenderness during pregnancy. This may help if worn during sleep. Pads or tissues may be used in the bra if you are leaking colostrum.  Do not use hot tubs, steam rooms or saunas throughout the pregnancy.  Wear your seat belt at all times when driving. This protects you and your baby if you are in an accident.  Avoid raw meat, uncooked cheese, cat litter boxes and soil used by cats. These carry germs that can cause birth defects in the baby.  The second trimester is also a good time to visit your dentist for your dental health if this has not been done yet. Getting your teeth cleaned is OK. Use a soft toothbrush. Brush gently during pregnancy.  It is easier to loose urine during pregnancy. Tightening up and strengthening the pelvic muscles will help with this problem. Practice stopping your  urination while you are going to the bathroom. These are the same muscles you need to strengthen. It is also the muscles you would use as if you were trying to stop from passing gas. You can practice tightening these muscles up 10 times a set and repeating this about 3 times per day. Once you know what muscles to tighten up, do not perform these exercises during urination. It is more likely to contribute to an infection by backing up the urine.  Ask for help if you have financial, counseling or nutritional needs during pregnancy. Your caregiver will be able to offer counseling for these needs as well as refer you for other special needs.  Your skin may become oily. If so, wash your face with mild soap, use non-greasy moisturizer and oil or cream based makeup. MEDICATIONS AND DRUG USE IN PREGNANCY  Take prenatal vitamins as directed. The vitamin should contain 1 milligram of folic acid. Keep all vitamins out of reach of children. Only a couple vitamins or tablets containing iron may be fatal to a baby or young child when ingested.  Avoid use of all medications, including herbs, over-the-counter medications, not prescribed or suggested by your caregiver. Only take over-the-counter or prescription medicines for pain, discomfort, or fever as directed by your caregiver. Do not use aspirin.  Let your caregiver also know about herbs you may be using.  Alcohol is related to a number of birth defects. This includes fetal alcohol syndrome. All alcohol, in any form, should be avoided completely. Smoking will cause low birth rate and premature babies.  Street or illegal drugs are very harmful to the baby. They are absolutely forbidden. A baby born to an addicted mother will be addicted at birth. The baby will go through the same withdrawal an adult does. SEEK MEDICAL CARE IF:  You have any concerns or worries during your pregnancy. It is better to call with your questions if you feel they cannot wait, rather  than worry about them. SEEK IMMEDIATE MEDICAL CARE IF:   An unexplained oral temperature above 102 F (38.9 C) develops, or as your caregiver suggests.  You have leaking of fluid from the vagina (birth canal). If leaking membranes are suspected, take your temperature and tell your caregiver of this when you call.  There is vaginal spotting, bleeding, or passing clots. Tell your caregiver of the amount and how many pads are used. Light spotting in pregnancy is common, especially following intercourse.  You develop a bad smelling vaginal discharge with a change in the color from clear   to white.  You continue to feel sick to your stomach (nauseated) and have no relief from remedies suggested. You vomit blood or coffee ground-like materials.  You lose more than 2 pounds of weight or gain more than 2 pounds of weight over 1 week, or as suggested by your caregiver.  You notice swelling of your face, hands, feet, or legs.  You get exposed to German measles and have never had them.  You are exposed to fifth disease or chickenpox.  You develop belly (abdominal) pain. Round ligament discomfort is a common non-cancerous (benign) cause of abdominal pain in pregnancy. Your caregiver still must evaluate you.  You develop a bad headache that does not go away.  You develop fever, diarrhea, pain with urination, or shortness of breath.  You develop visual problems, blurry, or double vision.  You fall or are in a car accident or any kind of trauma.  There is mental or physical violence at home. Document Released: 02/18/2001 Document Revised: 05/19/2011 Document Reviewed: 08/23/2008 [employer] Patient Information 2013 [employer]  AFP Maternal This is a routine screen (tests) used to check for fetal abnormalities such as Down syndrome and neural tube defects. Down Syndrome is a chromosomal abnormality, sometimes called Trisomy 21. Neural tube defects are serious birth defects. The brain, spinal  cord, or their coverings do not develop completely. Women should be tested in the 15th to 20th week of pregnancy. The msAFP screen involves three or four tests that measure substances found in the blood that make the testing better. During development, AFP levels in fetal blood and amniotic fluid rise until about 12 weeks. The levels then gradually fall until birth. AFP is a protein produce by fetal tissue. AFP crosses the placenta and appears in the maternal blood. A baby with an open neural tube defect has an opening in its spine, head, or abdominal wall that allows higher-than-usual amounts of AFP to pass into the mother's blood. If a screen is positive, more tests are needed to make a diagnosis. These include ultrasound and perhaps amniocentesis (checking the fluid that surrounds the baby). These tests are used to help women and their caregivers make decisions about the management of their pregnancies. In pregnancies where the fetus is carrying the chromosomal defect that results in Down syndrome, the levels of AFP and unconjugated estriol tend to be low and hCG and inhibin A levels high.  PREPARATION FOR TEST Blood is drawn from a vein in your arm usually between the 15th and 20th weeks of pregnancy. Four different tests on your blood are done. These are AFP, hCG, unconjugated estriol, and inhibin A. The combination of tests produces a more accurate result. NORMAL FINDINGS   Adult: less than 40ng/mL or less than 40 mg/L (SI units)  Child younger than1 year: less than 30 ng/mL Ranges are stratified by weeks of gestation and vary among laboratories. Ranges for normal findings may vary among different laboratories and hospitals. You should always check with your doctor after having lab work or other tests done to discuss the meaning of your test results and whether your values are considered within normal limits. MEANING OF TEST  These are screening tests. Not all fetal abnormalities will give  positive test results. Of all women who have positive AFP screening results, only a very small number of them have babies who actually have a neural tube defect or chromosomal abnormality. Your caregiver will go over the test results with you and discuss the importance and meaning of   your results, as well as treatment options and the need for additional tests if necessary. OBTAINING THE TEST RESULTS It is your responsibility to obtain your test results. Ask the lab or department performing the test when and how you will get your results. Document Released: 03/18/2004 Document Revised: 05/19/2011 Document Reviewed: 01/29/2008 [employer] Patient Information 2013 [employer].   

## 2012-03-31 NOTE — Progress Notes (Signed)
Pulse- 93 Patient reports vaginal pain starting last night; patient states she has only felt the baby move twice in the pregnancy so far  Needs ob panel, HIV, hgb electrophoresis, urine culture, gc/ch, & 1 hr gtt @ 1103

## 2012-03-31 NOTE — Progress Notes (Signed)
Subjective:    Mary [name] is being seen today for her first obstetrical visit.  This is not a planned pregnancy. Took morning after pill after unprotected intercourse in early September (9/8 she thinks) but then missed her period and found out she was pregnant. She is adjusting to the idea of being pregnant. Her first child is only 57 months old. She is at [redacted]w[redacted]d gestation by U/S done at [facility] on 10/30. However, she states she had an u/s done in [place] later in pregnancy that dates her about 3 weeks further along. Per her approximate LMP and date of conception, pt would be [redacted]w[redacted]d. Her obstetrical history is significant for pregnancy induced hypertension with her first pregnancy, no preeclampsia, not on magnesium per pt.  Relationship with FOB: significant other, not living together. Pt's father is here in [place] and is supportive. Patient does not want to breastfeed but plans to pump. Pregnancy history fully reviewed. No nausea/vomiting at this time. Is constipated, already on stool softener.  Menstrual History: OB History    Grav Para Term Preterm Abortions TAB SAB Ect Mult Living   2 1 1  0 0 0 0 0 0 1      Menarche age: 29 No LMP recorded - per pt around last week of August. Patient is pregnant.   Common ambulatory SmartLinks:[id number]}  Review of Systems Constitutional: negative for chills, fatigue, fevers and sweats Eyes: negative for visual disturbance Respiratory: no cough, wheeze, dyspnea Cardiovascular: no chest pain or palpitations Gastrointestinal: no nausea, vomiting, diarrhea, abdominal pain, pos for constipation Genitourinary: no dysuria or urgency Musculoskeletal: low back pain, constant Neurological: no headache Behavioral/Psych: no depression or anxiety    Objective:    General appearance: alert, cooperative and no distress Head: Normocephalic, without obvious abnormality, atraumatic Eyes: conjunctivae/corneas clear. PERRL, EOM's intact. Fundi benign. Neck: no  adenopathy, no carotid bruit, supple, symmetrical, trachea midline and thyroid not enlarged, symmetric, no tenderness/mass/nodules Lungs: clear to auscultation bilaterally Heart: regular rate and rhythm, S1, S2 normal, no murmur, click, rub or gallop Abdomen: gravid, fundus at umbilicus, soft, non-tender, normal bowel sounds. Pelvic: cervix normal in appearance, external genitalia normal, no adnexal masses or tenderness, no cervical motion tenderness and vagina normal without discharge Extremities: extremities normal, atraumatic, no cyanosis or edema Pulses: 2+ and symmetric Skin: Skin color, texture, turgor normal. No rashes or lesions Neurologic: Grossly normal    Assessment:    Pregnancy at 18 weeks by early sono, [redacted]w[redacted]d by date of conception    Plan:   Initial labs drawn. Prenatal vitamins. Problem list reviewed and updated. AFP3 discussed: requested.  Role of ultrasound in pregnancy discussed; fetal survey: requested. Amniocentesis discussed: not indicated. Follow up in 4 weeks. Constipation. Add miralax or metamucil as needed, continue colace. Dating uncertain - will get detailed u/s asap for dating, fetal survey. 50% of 45 min visit spent on counseling and coordination of care.

## 2012-04-01 LAB — OBSTETRIC PANEL
Antibody Screen: NEGATIVE
Basophils Absolute: 0 10*3/uL (ref 0.0–0.1)
Eosinophils Absolute: 0.1 10*3/uL (ref 0.0–0.7)
Eosinophils Relative: 1 % (ref 0–5)
HCT: 30.8 % — ABNORMAL LOW (ref 36.0–46.0)
MCH: 30.4 pg (ref 26.0–34.0)
MCHC: 34.7 g/dL (ref 30.0–36.0)
MCV: 87.5 fL (ref 78.0–100.0)
Monocytes Absolute: 0.1 10*3/uL (ref 0.1–1.0)
Platelets: 358 10*3/uL (ref 150–400)
RDW: 15.6 % — ABNORMAL HIGH (ref 11.5–15.5)
WBC: 5.4 10*3/uL (ref 4.0–10.5)

## 2012-04-01 LAB — HIV ANTIBODY (ROUTINE TESTING W REFLEX): HIV: NONREACTIVE

## 2012-04-02 LAB — CULTURE, OB URINE

## 2012-04-04 ENCOUNTER — Encounter: Payer: Self-pay | Admitting: Family Medicine

## 2012-04-05 LAB — HEMOGLOBINOPATHY EVALUATION
Hgb A: 95.4 % — ABNORMAL LOW (ref 96.8–97.8)
Hgb F Quant: 1.7 % (ref 0.0–2.0)
Hgb S Quant: 0 %

## 2012-04-07 ENCOUNTER — Encounter: Payer: Self-pay | Admitting: *Deleted

## 2012-04-08 ENCOUNTER — Encounter: Payer: Self-pay | Admitting: Family Medicine

## 2012-04-28 ENCOUNTER — Encounter: Payer: Self-pay | Admitting: *Deleted

## 2012-04-28 ENCOUNTER — Ambulatory Visit (INDEPENDENT_AMBULATORY_CARE_PROVIDER_SITE_OTHER): Payer: Medicaid Other | Admitting: Family Medicine

## 2012-04-28 ENCOUNTER — Other Ambulatory Visit: Payer: Self-pay | Admitting: Advanced Practice Midwife

## 2012-04-28 VITALS — BP 124/77 | Temp 97.6°F | Wt 191.8 lb

## 2012-04-28 DIAGNOSIS — E049 Nontoxic goiter, unspecified: Secondary | ICD-10-CM

## 2012-04-28 LAB — POCT URINALYSIS DIP (DEVICE)
Bilirubin Urine: NEGATIVE
Glucose, UA: NEGATIVE mg/dL
Hgb urine dipstick: NEGATIVE
Ketones, ur: NEGATIVE mg/dL
Leukocytes, UA: NEGATIVE
Nitrite: NEGATIVE
pH: 7 (ref 5.0–8.0)

## 2012-04-28 MED ORDER — CYCLOBENZAPRINE HCL 5 MG PO TABS: 10.0000 mg | ORAL_TABLET | Freq: Three times a day (TID) | ORAL | Status: DC | PRN

## 2012-04-28 NOTE — Progress Notes (Signed)
Pulse- 107  Edema-ankle/feet Pain-"cramping"

## 2012-04-28 NOTE — Progress Notes (Signed)
Back pain past 3 weeks. Mid upper to left flank. No injury. Achy, constant when present but comes and goes.No specific factors seem to bring it on or relieve it. Interferes with school.  Keeps her awake at night. Tylenol helps a little. No numbness, tingling, weakness. No point tenderness or CVA tenderness on exam. Try flexeril, refer to PT.  Hx thyroid problem. Per pt enlarged thyroid a few years ago. Per chart, thyromegaly on exam in 2010 but U/S and TSH normal. Never on meds. Pt states she was supposed to f/u every 3-6 months but didn't. Mild fullness of thyroid but no masses or asymetry. Will get TSH today.

## 2012-04-28 NOTE — Patient Instructions (Addendum)
Pregnancy - Second Trimester The second trimester of pregnancy (3 to 6 months) is a period of rapid growth for you and your baby. At the end of the sixth month, your baby is about 9 inches long and weighs 1 1/2 pounds. You will begin to feel the baby move between 18 and 20 weeks of the pregnancy. This is called quickening. Weight gain is faster. A clear fluid (colostrum) may leak out of your breasts. You may feel small contractions of the womb (uterus). This is known as false labor or Braxton-Hicks contractions. This is like a practice for labor when the baby is ready to be born. Usually, the problems with morning sickness have usually passed by the end of your first trimester. Some women develop small dark blotches (called cholasma, mask of pregnancy) on their face that usually goes away after the baby is born. Exposure to the sun makes the blotches worse. Acne may also develop in some pregnant women and pregnant women who have acne, may find that it goes away. PRENATAL EXAMS  Blood work may continue to be done during prenatal exams. These tests are done to check on your health and the probable health of your baby. Blood work is used to follow your blood levels (hemoglobin). Anemia (low hemoglobin) is common during pregnancy. Iron and vitamins are given to help prevent this. You will also be checked for diabetes between 24 and 28 weeks of the pregnancy. Some of the previous blood tests may be repeated.  The size of the uterus is measured during each visit. This is to make sure that the baby is continuing to grow properly according to the dates of the pregnancy.  Your blood pressure is checked every prenatal visit. This is to make sure you are not getting toxemia.  Your urine is checked to make sure you do not have an infection, diabetes or protein in the urine.  Your weight is checked often to make sure gains are happening at the suggested rate. This is to ensure that both you and your baby are growing  normally.  Sometimes, an ultrasound is performed to confirm the proper growth and development of the baby. This is a test which bounces harmless sound waves off the baby so your caregiver can more accurately determine due dates. Sometimes, a specialized test is done on the amniotic fluid surrounding the baby. This test is called an amniocentesis. The amniotic fluid is obtained by sticking a needle into the belly (abdomen). This is done to check the chromosomes in instances where there is a concern about possible genetic problems with the baby. It is also sometimes done near the end of pregnancy if an early delivery is required. In this case, it is done to help make sure the baby's lungs are mature enough for the baby to live outside of the womb. CHANGES OCCURING IN THE SECOND TRIMESTER OF PREGNANCY Your body goes through many changes during pregnancy. They vary from person to person. Talk to your caregiver about changes you notice that you are concerned about.  During the second trimester, you will likely have an increase in your appetite. It is normal to have cravings for certain foods. This varies from person to person and pregnancy to pregnancy.  Your lower abdomen will begin to bulge.  You may have to urinate more often because the uterus and baby are pressing on your bladder. It is also common to get more bladder infections during pregnancy (pain with urination). You can help this by   drinking lots of fluids and emptying your bladder before and after intercourse.  You may begin to get stretch marks on your hips, abdomen, and breasts. These are normal changes in the body during pregnancy. There are no exercises or medications to take that prevent this change.  You may begin to develop swollen and bulging veins (varicose veins) in your legs. Wearing support hose, elevating your feet for 15 minutes, 3 to 4 times a day and limiting salt in your diet helps lessen the problem.  Heartburn may develop  as the uterus grows and pushes up against the stomach. Antacids recommended by your caregiver helps with this problem. Also, eating smaller meals 4 to 5 times a day helps.  Constipation can be treated with a stool softener or adding bulk to your diet. Drinking lots of fluids, vegetables, fruits, and whole grains are helpful.  Exercising is also helpful. If you have been very active up until your pregnancy, most of these activities can be continued during your pregnancy. If you have been less active, it is helpful to start an exercise program such as walking.  Hemorrhoids (varicose veins in the rectum) may develop at the end of the second trimester. Warm sitz baths and hemorrhoid cream recommended by your caregiver helps hemorrhoid problems.  Backaches may develop during this time of your pregnancy. Avoid heavy lifting, wear low heal shoes and practice good posture to help with backache problems.  Some pregnant women develop tingling and numbness of their hand and fingers because of swelling and tightening of ligaments in the wrist (carpel tunnel syndrome). This goes away after the baby is born.  As your breasts enlarge, you may have to get a bigger bra. Get a comfortable, cotton, support bra. Do not get a nursing bra until the last month of the pregnancy if you will be nursing the baby.  You may get a dark line from your belly button to the pubic area called the linea nigra.  You may develop rosy cheeks because of increase blood flow to the face.  You may develop spider looking lines of the face, neck, arms and chest. These go away after the baby is born. HOME CARE INSTRUCTIONS   It is extremely important to avoid all smoking, herbs, alcohol, and unprescribed drugs during your pregnancy. These chemicals affect the formation and growth of the baby. Avoid these chemicals throughout the pregnancy to ensure the delivery of a healthy infant.  Most of your home care instructions are the same as  suggested for the first trimester of your pregnancy. Keep your caregiver's appointments. Follow your caregiver's instructions regarding medication use, exercise and diet.  During pregnancy, you are providing food for you and your baby. Continue to eat regular, well-balanced meals. Choose foods such as meat, fish, milk and other low fat dairy products, vegetables, fruits, and whole-grain breads and cereals. Your caregiver will tell you of the ideal weight gain.  A physical sexual relationship may be continued up until near the end of pregnancy if there are no other problems. Problems could include early (premature) leaking of amniotic fluid from the membranes, vaginal bleeding, abdominal pain, or other medical or pregnancy problems.  Exercise regularly if there are no restrictions. Check with your caregiver if you are unsure of the safety of some of your exercises. The greatest weight gain will occur in the last 2 trimesters of pregnancy. Exercise will help you:  Control your weight.  Get you in shape for labor and delivery.  Lose weight   after you have the baby.  Wear a good support or jogging bra for breast tenderness during pregnancy. This may help if worn during sleep. Pads or tissues may be used in the bra if you are leaking colostrum.  Do not use hot tubs, steam rooms or saunas throughout the pregnancy.  Wear your seat belt at all times when driving. This protects you and your baby if you are in an accident.  Avoid raw meat, uncooked cheese, cat litter boxes and soil used by cats. These carry germs that can cause birth defects in the baby.  The second trimester is also a good time to visit your dentist for your dental health if this has not been done yet. Getting your teeth cleaned is OK. Use a soft toothbrush. Brush gently during pregnancy.  It is easier to loose urine during pregnancy. Tightening up and strengthening the pelvic muscles will help with this problem. Practice stopping your  urination while you are going to the bathroom. These are the same muscles you need to strengthen. It is also the muscles you would use as if you were trying to stop from passing gas. You can practice tightening these muscles up 10 times a set and repeating this about 3 times per day. Once you know what muscles to tighten up, do not perform these exercises during urination. It is more likely to contribute to an infection by backing up the urine.  Ask for help if you have financial, counseling or nutritional needs during pregnancy. Your caregiver will be able to offer counseling for these needs as well as refer you for other special needs.  Your skin may become oily. If so, wash your face with mild soap, use non-greasy moisturizer and oil or cream based makeup. MEDICATIONS AND DRUG USE IN PREGNANCY  Take prenatal vitamins as directed. The vitamin should contain 1 milligram of folic acid. Keep all vitamins out of reach of children. Only a couple vitamins or tablets containing iron may be fatal to a baby or young child when ingested.  Avoid use of all medications, including herbs, over-the-counter medications, not prescribed or suggested by your caregiver. Only take over-the-counter or prescription medicines for pain, discomfort, or fever as directed by your caregiver. Do not use aspirin.  Let your caregiver also know about herbs you may be using.  Alcohol is related to a number of birth defects. This includes fetal alcohol syndrome. All alcohol, in any form, should be avoided completely. Smoking will cause low birth rate and premature babies.  Street or illegal drugs are very harmful to the baby. They are absolutely forbidden. A baby born to an addicted mother will be addicted at birth. The baby will go through the same withdrawal an adult does. SEEK MEDICAL CARE IF:  You have any concerns or worries during your pregnancy. It is better to call with your questions if you feel they cannot wait, rather  than worry about them. SEEK IMMEDIATE MEDICAL CARE IF:   An unexplained oral temperature above 102 F (38.9 C) develops, or as your caregiver suggests.  You have leaking of fluid from the vagina (birth canal). If leaking membranes are suspected, take your temperature and tell your caregiver of this when you call.  There is vaginal spotting, bleeding, or passing clots. Tell your caregiver of the amount and how many pads are used. Light spotting in pregnancy is common, especially following intercourse.  You develop a bad smelling vaginal discharge with a change in the color from clear   to white.  You continue to feel sick to your stomach (nauseated) and have no relief from remedies suggested. You vomit blood or coffee ground-like materials.  You lose more than 2 pounds of weight or gain more than 2 pounds of weight over 1 week, or as suggested by your caregiver.  You notice swelling of your face, hands, feet, or legs.  You get exposed to [place] measles and have never had them.  You are exposed to fifth disease or chickenpox.  You develop belly (abdominal) pain. Round ligament discomfort is a common non-cancerous (benign) cause of abdominal pain in pregnancy. Your caregiver still must evaluate you.  You develop a bad headache that does not go away.  You develop fever, diarrhea, pain with urination, or shortness of breath.  You develop visual problems, blurry, or double vision.  You fall or are in a car accident or any kind of trauma.  There is mental or physical violence at home. Document Released: 02/18/2001 Document Revised: 05/19/2011 Document Reviewed: 08/23/2008 [facility] Patient Information 2013 [place], [place].  Back Pain in Pregnancy Back pain during pregnancy is common. It happens in about half of all pregnancies. It is important for you and your baby that you remain active during your pregnancy.If you feel that back pain is not allowing you to remain active or sleep well,  it is time to see your caregiver. Back pain may be caused by several factors related to changes during your pregnancy.Fortunately, unless you had trouble with your back before your pregnancy, the pain is likely to get better after you deliver. Low back pain usually occurs between the fifth and seventh months of pregnancy. It can, however, happen in the first couple months. Factors that increase the risk of back problems include:   Previous back problems.  Injury to your back.  Having twins or multiple births.  A chronic cough.  Stress.  Job-related repetitive motions.  Muscle or spinal disease in the back.  Family history of back problems, ruptured (herniated) discs, or osteoporosis.  Depression, anxiety, and panic attacks. CAUSES   When you are pregnant, your body produces a hormone called relaxin. This hormonemakes the ligaments connecting the low back and pubic bones more flexible. This flexibility allows the baby to be delivered more easily. When your ligaments are loose, your muscles need to work harder to support your back. Soreness in your back can come from tired muscles. Soreness can also come from back tissues that are irritated since they are receiving less support.  As the baby grows, it puts pressure on the nerves and blood vessels in your pelvis. This can cause back pain.  As the baby grows and gets heavier during pregnancy, the uterus pushes the stomach muscles forward and changes your center of gravity. This makes your back muscles work harder to maintain good posture. SYMPTOMS  Lumbar pain during pregnancy Lumbar pain during pregnancy usually occurs at or above the waist in the center of the back. There may be pain and numbness that radiates into your leg or foot. This is similar to low back pain experienced by non-pregnant women. It usually increases with sitting for long periods of time, standing, or repetitive lifting. Tenderness may also be present in the muscles  along your upper back. Posterior pelvic pain during pregnancy Pain in the back of the pelvis is more common than lumbar pain in pregnancy. It is a deep pain felt in your side at the waistline, or across the tailbone (sacrum), or in both  places. You may have pain on one or both sides. This pain can also go into the buttocks and backs of the upper thighs. Pubic and groin pain may also be present. The pain does not quickly resolve with rest, and morning stiffness may also be present. Pelvic pain during pregnancy can be brought on by most activities. A high level of fitness before and during pregnancy may or may not prevent this problem. Labor pain is usually 1 to 2 minutes apart, lasts for about 1 minute, and involves a bearing down feeling or pressure in your pelvis. However, if you are at term with the pregnancy, constant low back pain can be the beginning of early labor, and you should be aware of this. DIAGNOSIS  X-rays of the back should not be done during the first 12 to 14 weeks of the pregnancy and only when absolutely necessary during the rest of the pregnancy. MRIs do not give off radiation and are safe during pregnancy. MRIs also should only be done when absolutely necessary. HOME CARE INSTRUCTIONS  Exercise as directed by your caregiver. Exercise is the most effective way to prevent or manage back pain. If you have a back problem, it is especially important to avoid sports that require sudden body movements. Swimming and walking are great activities.  Do not stand in one place for long periods of time.  Do not wear high heels.  Sit in chairs with good posture. Use a pillow on your lower back if necessary. Make sure your head rests over your shoulders and is not hanging forward.  Try sleeping on your side, preferably the left side, with a pillow or two between your legs. If you are sore after a night's rest, your bedmay betoo soft.Try placing a board between your mattress and box  spring.  Listen to your body when lifting.If you are experiencing pain, ask for help or try bending yourknees more so you can use your leg muscles rather than your back muscles. Squat down when picking up something from the floor. Do not bend over.  Eat a healthy diet. Try to gain weight within your caregiver's recommendations.  Use heat or cold packs 3 to 4 times a day for 15 minutes to help with the pain.  Only take over-the-counter or prescription medicines for pain, discomfort, or fever as directed by your caregiver. Sudden (acute) back pain  Use bed rest for only the most extreme, acute episodes of back pain. Prolonged bed rest over 48 hours will aggravate your condition.  Ice is very effective for acute conditions.  Put ice in a plastic bag.  Place a towel between your skin and the bag.  Leave the ice on for 10 to 20 minutes every 2 hours, or as needed.  Using heat packs for 30 minutes prior to activities is also helpful. Continued back pain See your caregiver if you have continued problems. Your caregiver can help or refer you for appropriate physical therapy. With conditioning, most back problems can be avoided. Sometimes, a more serious issue may be the cause of back pain. You should be seen right away if new problems seem to be developing. Your caregiver may recommend:  A maternity girdle.  An elastic sling.  A back brace.  A massage therapist or acupuncture. SEEK MEDICAL CARE IF:   You are not able to do most of your daily activities, even when taking the pain medicine you were given.  You need a referral to a physical therapist or chiropractor.  You want to try acupuncture. SEEK IMMEDIATE MEDICAL CARE IF:  You develop numbness, tingling, weakness, or problems with the use of your arms or legs.  You develop severe back pain that is no longer relieved with medicines.  You have a sudden change in bowel or bladder control.  You have increasing pain in other  areas of the body.  You develop shortness of breath, dizziness, or fainting.  You develop nausea, vomiting, or sweating.  You have back pain which is similar to labor pains.  You have back pain along with your water breaking or vaginal bleeding.  You have back pain or numbness that travels down your leg.  Your back pain developed after you fell.  You develop pain on one side of your back. You may have a kidney stone.  You see blood in your urine. You may have a bladder infection or kidney stone.  You have back pain with blisters. You may have shingles. Back pain is fairly common during pregnancy but should not be accepted as just part of the process. Back pain should always be treated as soon as possible. This will make your pregnancy as pleasant as possible. Document Released: 06/04/2005 Document Revised: 05/19/2011 Document Reviewed: 07/16/2010 [facility] Patient Information 2013 [place], [place].

## 2012-04-30 ENCOUNTER — Encounter: Payer: Self-pay | Admitting: *Deleted

## 2012-05-24 ENCOUNTER — Encounter (HOSPITAL_COMMUNITY): Payer: Self-pay | Admitting: *Deleted

## 2012-05-24 ENCOUNTER — Inpatient Hospital Stay (HOSPITAL_COMMUNITY)
Admission: AD | Admit: 2012-05-24 | Discharge: 2012-05-24 | Disposition: A | Discharge status: Home / Self Care | Payer: Medicaid Other | Source: Ambulatory Visit | Attending: Obstetrics & Gynecology | Admitting: Obstetrics & Gynecology

## 2012-05-24 DIAGNOSIS — O9989 Other specified diseases and conditions complicating pregnancy, childbirth and the puerperium: Secondary | ICD-10-CM

## 2012-05-24 DIAGNOSIS — O4700 False labor before 37 completed weeks of gestation, unspecified trimester: Secondary | ICD-10-CM | POA: Insufficient documentation

## 2012-05-24 DIAGNOSIS — R1033 Periumbilical pain: Secondary | ICD-10-CM | POA: Insufficient documentation

## 2012-05-24 DIAGNOSIS — O99891 Other specified diseases and conditions complicating pregnancy: Secondary | ICD-10-CM | POA: Insufficient documentation

## 2012-05-24 LAB — URINALYSIS, ROUTINE W REFLEX MICROSCOPIC
Hgb urine dipstick: NEGATIVE
Leukocytes, UA: NEGATIVE
Protein, ur: NEGATIVE mg/dL
Specific Gravity, Urine: 1.015 (ref 1.005–1.030)
Urobilinogen, UA: 0.2 mg/dL (ref 0.0–1.0)

## 2012-05-24 LAB — CBC
Platelets: 312 10*3/uL (ref 150–400)
RBC: 3.22 MIL/uL — ABNORMAL LOW (ref 3.87–5.11)
RDW: 14.2 % (ref 11.5–15.5)
WBC: 7.7 10*3/uL (ref 4.0–10.5)

## 2012-05-24 MED ORDER — ACETAMINOPHEN 500 MG PO TABS
1000.0000 mg | ORAL_TABLET | Freq: Once | ORAL | Status: AC
  Administered 2012-05-24: 1000 mg via ORAL
  Filled 2012-05-24: qty 2

## 2012-05-24 MED ORDER — OXYCODONE-ACETAMINOPHEN 5-325 MG PO TABS: 2.0000 | ORAL_TABLET | ORAL | Status: DC | PRN
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
## 2012-05-24 NOTE — MAU Note (Signed)
Name and DOB verified, pt confirms spelling is correct on armband.  Reports + fetal movement.
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
## 2012-05-24 NOTE — MAU Note (Signed)
Was cramping last night.  That has stopped, but still having pain around belly button, feels like it is tightening around it off and on .

## 2012-05-26 ENCOUNTER — Ambulatory Visit (INDEPENDENT_AMBULATORY_CARE_PROVIDER_SITE_OTHER): Payer: Medicaid Other | Admitting: Family Medicine

## 2012-05-26 ENCOUNTER — Other Ambulatory Visit: Payer: Self-pay | Admitting: Family Medicine

## 2012-05-26 ENCOUNTER — Encounter: Payer: Self-pay | Admitting: *Deleted

## 2012-05-26 ENCOUNTER — Encounter: Payer: Self-pay | Admitting: Family Medicine

## 2012-05-26 VITALS — BP 125/76 | Temp 97.4°F | Wt 196.2 lb

## 2012-05-26 DIAGNOSIS — K429 Umbilical hernia without obstruction or gangrene: Secondary | ICD-10-CM

## 2012-05-26 DIAGNOSIS — O2390 Unspecified genitourinary tract infection in pregnancy, unspecified trimester: Secondary | ICD-10-CM | POA: Insufficient documentation

## 2012-05-26 LAB — POCT URINALYSIS DIP (DEVICE)
Bilirubin Urine: NEGATIVE
Glucose, UA: NEGATIVE mg/dL
Ketones, ur: NEGATIVE mg/dL
Leukocytes, UA: NEGATIVE
Specific Gravity, Urine: 1.015 (ref 1.005–1.030)

## 2012-05-26 NOTE — Progress Notes (Signed)
Doing well--Umbilical hernia--reducible--instructions given. 28 wk labs next visit.

## 2012-05-26 NOTE — Progress Notes (Signed)
Pt c/o pain around her belly button.

## 2012-05-26 NOTE — Patient Instructions (Signed)
Hernia A hernia occurs when an internal organ pushes out through a weak spot in the abdominal wall. Hernias most commonly occur in the groin and around the navel. Hernias often can be pushed back into place (reduced). Most hernias tend to get worse over time. Some abdominal hernias can get stuck in the opening (irreducible or incarcerated hernia) and cannot be reduced. An irreducible abdominal hernia which is tightly squeezed into the opening is at risk for impaired blood supply (strangulated hernia). A strangulated hernia is a medical emergency. Because of the risk for an irreducible or strangulated hernia, surgery may be recommended to repair a hernia. CAUSES   Heavy lifting.  Prolonged coughing.  Straining to have a bowel movement.  A cut (incision) made during an abdominal surgery. HOME CARE INSTRUCTIONS   Bed rest is not required. You may continue your normal activities.  Avoid lifting more than 10 pounds (4.5 kg) or straining.  Cough gently. If you are a smoker it is best to stop. Even the best hernia repair can break down with the continual strain of coughing. Even if you do not have your hernia repaired, a cough will continue to aggravate the problem.  Do not wear anything tight over your hernia. Do not try to keep it in with an outside bandage or truss. These can damage abdominal contents if they are trapped within the hernia sac.  Eat a normal diet.  Avoid constipation. Straining over long periods of time will increase hernia size and encourage breakdown of repairs. If you cannot do this with diet alone, stool softeners may be used. SEEK IMMEDIATE MEDICAL CARE IF:   You have a fever.  You develop increasing abdominal pain.  You feel nauseous or vomit.  Your hernia is stuck outside the abdomen, looks discolored, feels hard, or is tender.  You have any changes in your bowel habits or in the hernia that are unusual for you.  You have increased pain or swelling around the  hernia.  You cannot push the hernia back in place by applying gentle pressure while lying down. MAKE SURE YOU:   Understand these instructions.  Will watch your condition.  Will get help right away if you are not doing well or get worse. Document Released: 02/24/2005 Document Revised: 05/19/2011 Document Reviewed: 10/14/2007 [employer] Patient Information 2013 [employer].  

## 2012-06-14 ENCOUNTER — Inpatient Hospital Stay (HOSPITAL_COMMUNITY)
Admission: AD | Admit: 2012-06-14 | Discharge: 2012-06-14 | Disposition: A | Discharge status: Home / Self Care | Payer: Medicaid Other | Source: Ambulatory Visit | Attending: Obstetrics & Gynecology | Admitting: Obstetrics & Gynecology

## 2012-06-14 ENCOUNTER — Encounter (HOSPITAL_COMMUNITY): Payer: Self-pay | Admitting: *Deleted

## 2012-06-14 DIAGNOSIS — M545 Low back pain, unspecified: Secondary | ICD-10-CM | POA: Insufficient documentation

## 2012-06-14 DIAGNOSIS — O2390 Unspecified genitourinary tract infection in pregnancy, unspecified trimester: Secondary | ICD-10-CM | POA: Insufficient documentation

## 2012-06-14 DIAGNOSIS — O99891 Other specified diseases and conditions complicating pregnancy: Secondary | ICD-10-CM | POA: Insufficient documentation

## 2012-06-14 DIAGNOSIS — B3731 Acute candidiasis of vulva and vagina: Secondary | ICD-10-CM | POA: Insufficient documentation

## 2012-06-14 DIAGNOSIS — N949 Unspecified condition associated with female genital organs and menstrual cycle: Secondary | ICD-10-CM | POA: Insufficient documentation

## 2012-06-14 DIAGNOSIS — N760 Acute vaginitis: Secondary | ICD-10-CM

## 2012-06-14 DIAGNOSIS — M549 Dorsalgia, unspecified: Secondary | ICD-10-CM

## 2012-06-14 DIAGNOSIS — B373 Candidiasis of vulva and vagina: Secondary | ICD-10-CM | POA: Insufficient documentation

## 2012-06-14 DIAGNOSIS — O26899 Other specified pregnancy related conditions, unspecified trimester: Secondary | ICD-10-CM

## 2012-06-14 DIAGNOSIS — R209 Unspecified disturbances of skin sensation: Secondary | ICD-10-CM | POA: Insufficient documentation

## 2012-06-14 LAB — URINALYSIS, ROUTINE W REFLEX MICROSCOPIC
Glucose, UA: NEGATIVE mg/dL
Ketones, ur: NEGATIVE mg/dL
Leukocytes, UA: NEGATIVE
Nitrite: NEGATIVE
Specific Gravity, Urine: 1.02 (ref 1.005–1.030)
pH: 7 (ref 5.0–8.0)

## 2012-06-14 LAB — WET PREP, GENITAL
Clue Cells Wet Prep HPF POC: NONE SEEN
Trich, Wet Prep: NONE SEEN
Yeast Wet Prep HPF POC: NONE SEEN

## 2012-06-14 MED ORDER — FLUCONAZOLE 150 MG PO TABS
150.0000 mg | ORAL_TABLET | Freq: Once | ORAL | Status: AC
  Administered 2012-06-14: 150 mg via ORAL
  Filled 2012-06-14: qty 1

## 2012-06-14 MED ORDER — PROMETHAZINE HCL 25 MG PO TABS
25.0000 mg | ORAL_TABLET | Freq: Four times a day (QID) | ORAL | Status: DC | PRN
  Administered 2012-06-14: 25 mg via ORAL
  Filled 2012-06-14: qty 1

## 2012-06-14 NOTE — MAU Note (Signed)
PT SAYS G2P1- VAG, EDC 6-25, FELT NAUSEA AT 8PM, BUT AT 2 PM  HER VAG BECAME NUMB WITH PRESSURE- THEN MOVED TO HER  THIGHS WHILE STANDING..   TO B-ROOM FOR  SPECIMEN.

## 2012-06-14 NOTE — MAU Provider Note (Signed)
History     CSN: [id number]  Arrival date and time: 06/14/12 2144   First Provider Initiated Contact with Patient 06/14/12 2220      No chief complaint on file.  HPI 22 y.o. G2P1001 at [redacted]w[redacted]d with vaginal numbness starting 1 pm today. Was standing up and felt pressure --> numbness in vagina and both legs. Getting better but still numb. Then got back pain and nausea all of a sudden around 8 pm. Pressure is constant. Has had hip/back pain for a while - worse with movement, lying down. Feels like right leg is going to give out on her when she gets up. Pain in lower back is 8/10 now.  Gets prenatal care at [facility]. Next visit 4/9. No complications of this pregnancy.   OB History   Grav Para Term Preterm Abortions TAB SAB Ect Mult Living   2 1 1  0 0 0 0 0 0 1      Past Medical History  Diagnosis Date  . Pregnancy induced hypertension 06-04-11    gestational hypertention  . Thyroid enlarged     Past Surgical History  Procedure Laterality Date  . No past surgeries      Family History  Problem Relation Age of Onset  . Diabetes Father   . Diabetes Paternal Grandfather   . Lupus Paternal Aunt   . Lupus Paternal Grandmother     History  Substance Use Topics  . Smoking status: Never Smoker   . Smokeless tobacco: Never Used  . Alcohol Use: No    Allergies: No Known Allergies  Prescriptions prior to admission  Medication Sig Dispense Refill  . Prenatal Vit-Fe Fumarate-FA (PRENATAL MULTIVITAMIN) TABS Take 1 tablet by mouth daily at 12 noon.      Marland Kitchen oxyCODONE-acetaminophen (PERCOCET/ROXICET) 5-325 MG per tablet Take 2 tablets by mouth every 4 (four) hours as needed for pain.  15 tablet  0    Review of Systems  Constitutional: Negative for fever and chills.  Eyes: Negative for blurred vision and double vision.  Gastrointestinal: Positive for nausea and constipation. Negative for vomiting and diarrhea.  Genitourinary: Negative for dysuria and urgency.  Musculoskeletal: Positive  for back pain and joint pain.  Neurological: Negative for dizziness and headaches.   Physical Exam   Blood pressure 118/61, pulse 88, temperature 100 F (37.8 C), temperature source Oral, resp. rate 18, height 5\' 3"  (1.6 m), weight 91.173 kg (201 lb), unknown if currently breastfeeding.  Physical Exam  Constitutional: She is oriented to person, place, and time. She appears well-developed and well-nourished. No distress.  HENT:  Head: Normocephalic and atraumatic.  Eyes: Conjunctivae and EOM are normal.  Neck: Normal range of motion. Neck supple.  Cardiovascular: Normal rate, regular rhythm and normal heart sounds.   Respiratory: Effort normal and breath sounds normal. No respiratory distress.  GI: Soft. Bowel sounds are normal. There is no tenderness. There is no rebound and no guarding.  NO tenderness in lower quadrants or suprapubically. Lower back is tender.  Genitourinary:  Normal external genitalia. Vagina very tender with exam, moderate white discharge. Cervix closed/long/high. Positive CMT and adnexal/suprapubic tenderness- but pain with entire exam, not just cervix.   Musculoskeletal: Normal range of motion. She exhibits no edema and no tenderness.  Neurological: She is alert and oriented to person, place, and time. She has normal reflexes.  Sensation intact and equal bilateral lower extremities. DTRs 2+. Strength 5/5 and equal bilaterally.  Skin: Skin is warm and dry.  Psychiatric: She has  a normal mood and affect.   Results for orders placed during the hospital encounter of 06/14/12 (from the past 24 hour(s))  URINALYSIS, ROUTINE W REFLEX MICROSCOPIC     Status: None   Collection Time    06/14/12  9:51 PM      Result Value Range   Color, Urine YELLOW  YELLOW   APPearance CLEAR  CLEAR   Specific Gravity, Urine 1.020  1.005 - 1.030   pH 7.0  5.0 - 8.0   Glucose, UA NEGATIVE  NEGATIVE mg/dL   Hgb urine dipstick NEGATIVE  NEGATIVE   Bilirubin Urine NEGATIVE  NEGATIVE    Ketones, ur NEGATIVE  NEGATIVE mg/dL   Protein, ur NEGATIVE  NEGATIVE mg/dL   Urobilinogen, UA 0.2  0.0 - 1.0 mg/dL   Nitrite NEGATIVE  NEGATIVE   Leukocytes, UA NEGATIVE  NEGATIVE  WET PREP, GENITAL     Status: Abnormal   Collection Time    06/14/12 10:25 PM      Result Value Range   Yeast Wet Prep HPF POC NONE SEEN  NONE SEEN   Trich, Wet Prep NONE SEEN  NONE SEEN   Clue Cells Wet Prep HPF POC NONE SEEN  NONE SEEN   WBC, Wet Prep HPF POC FEW (*) NONE SEEN    FHTs: 145, moderate variability, accels present,no decels TOCO:  No ctx.  MAU Course  Procedures   Assessment and Plan  22 y.o. G2P1001 at [redacted]w[redacted]d with  - Vaginal pain/numbness - treat for yeast empirically given clinical impression (diflucan x 1).  F/U on GC/Chlamydia. Not in labor, cervix closed. - Vaginal/leg numbness - may be related to back pain - Back pain/hip pain - tylenol, heating pad. Discuss PT at office visit Wednesday - F/U if fever/chills/intractable vomiting. WOC 06/16/12.  Napoleon Form 06/14/2012, 10:21 PM

## 2012-06-16 ENCOUNTER — Encounter: Payer: Self-pay | Admitting: Family Medicine

## 2012-06-16 ENCOUNTER — Other Ambulatory Visit: Payer: Self-pay | Admitting: Family Medicine

## 2012-06-16 ENCOUNTER — Ambulatory Visit (INDEPENDENT_AMBULATORY_CARE_PROVIDER_SITE_OTHER): Payer: Medicaid Other | Admitting: Family Medicine

## 2012-06-16 VITALS — BP 116/76 | Temp 98.0°F | Wt 197.0 lb

## 2012-06-16 DIAGNOSIS — O2390 Unspecified genitourinary tract infection in pregnancy, unspecified trimester: Secondary | ICD-10-CM

## 2012-06-16 DIAGNOSIS — Z3483 Encounter for supervision of other normal pregnancy, third trimester: Secondary | ICD-10-CM

## 2012-06-16 LAB — POCT URINALYSIS DIP (DEVICE)
Glucose, UA: NEGATIVE mg/dL
Hgb urine dipstick: NEGATIVE
Specific Gravity, Urine: 1.025 (ref 1.005–1.030)
Urobilinogen, UA: 1 mg/dL (ref 0.0–1.0)
pH: 7 (ref 5.0–8.0)

## 2012-06-16 LAB — CBC
HCT: 29 % — ABNORMAL LOW (ref 36.0–46.0)
MCHC: 33.4 g/dL (ref 30.0–36.0)
MCV: 89 fL (ref 78.0–100.0)
Platelets: 328 10*3/uL (ref 150–400)
RBC: 3.26 MIL/uL — ABNORMAL LOW (ref 3.87–5.11)
WBC: 5.3 10*3/uL (ref 4.0–10.5)

## 2012-06-16 MED ORDER — PRENATAL MULTIVITAMIN CH: 1.0000 | ORAL_TABLET | Freq: Every day | ORAL | Status: DC

## 2012-06-16 NOTE — Progress Notes (Signed)
Feeling occasional pelvic pressure, seen in MAU 2 days ago, treated for yeast. Sounds like Braxton-Hicks, has improved since MAU visit. No bleeding/LOF. Cervix closed and long in MAU.  Early 1 hr GTT normal. Repeat today with 28 wk labs.

## 2012-06-16 NOTE — Patient Instructions (Signed)
Pregnancy - Third Trimester The third trimester of pregnancy (the last 3 months) is a period of the most rapid growth for you and your baby. The baby approaches a length of 20 inches and a weight of 6 to 10 pounds. The baby is adding on fat and getting ready for life outside your body. While inside, babies have periods of sleeping and waking, suck their thumbs, and hiccups. You can often feel small contractions of the uterus. This is false labor. It is also called Braxton-Hicks contractions. This is like a practice for labor. The usual problems in this stage of pregnancy include more difficulty breathing, swelling of the hands and feet from water retention, and having to urinate more often because of the uterus and baby pressing on your bladder.  PRENATAL EXAMS  Blood work may continue to be done during prenatal exams. These tests are done to check on your health and the probable health of your baby. Blood work is used to follow your blood levels (hemoglobin). Anemia (low hemoglobin) is common during pregnancy. Iron and vitamins are given to help prevent this. You may also continue to be checked for diabetes. Some of the past blood tests may be done again.  The size of the uterus is measured during each visit. This makes sure your baby is growing properly according to your pregnancy dates.  Your blood pressure is checked every prenatal visit. This is to make sure you are not getting toxemia.  Your urine is checked every prenatal visit for infection, diabetes and protein.  Your weight is checked at each visit. This is done to make sure gains are happening at the suggested rate and that you and your baby are growing normally.  Sometimes, an ultrasound is performed to confirm the position and the proper growth and development of the baby. This is a test done that bounces harmless sound waves off the baby so your caregiver can more accurately determine due dates.  Discuss the type of pain medication and  anesthesia you will have during your labor and delivery.  Discuss the possibility and anesthesia if a Cesarean Section might be necessary.  Inform your caregiver if there is any mental or physical violence at home. Sometimes, a specialized non-stress test, contraction stress test and biophysical profile are done to make sure the baby is not having a problem. Checking the amniotic fluid surrounding the baby is called an amniocentesis. The amniotic fluid is removed by sticking a needle into the belly (abdomen). This is sometimes done near the end of pregnancy if an early delivery is required. In this case, it is done to help make sure the baby's lungs are mature enough for the baby to live outside of the womb. If the lungs are not mature and it is unsafe to deliver the baby, an injection of cortisone medication is given to the mother 1 to 2 days before the delivery. This helps the baby's lungs mature and makes it safer to deliver the baby. CHANGES OCCURING IN THE THIRD TRIMESTER OF PREGNANCY Your body goes through many changes during pregnancy. They vary from person to person. Talk to your caregiver about changes you notice and are concerned about.  During the last trimester, you have probably had an increase in your appetite. It is normal to have cravings for certain foods. This varies from person to person and pregnancy to pregnancy.  You may begin to get stretch marks on your hips, abdomen, and breasts. These are normal changes in the body  during pregnancy. There are no exercises or medications to take which prevent this change.  Constipation may be treated with a stool softener or adding bulk to your diet. Drinking lots of fluids, fiber in vegetables, fruits, and whole grains are helpful.  Exercising is also helpful. If you have been very active up until your pregnancy, most of these activities can be continued during your pregnancy. If you have been less active, it is helpful to start an exercise  program such as walking. Consult your caregiver before starting exercise programs.  Avoid all smoking, alcohol, un-prescribed drugs, herbs and "street drugs" during your pregnancy. These chemicals affect the formation and growth of the baby. Avoid chemicals throughout the pregnancy to ensure the delivery of a healthy infant.  Backache, varicose veins and hemorrhoids may develop or get worse.  You will tire more easily in the third trimester, which is normal.  The baby's movements may be stronger and more often.  You may become short of breath easily.  Your belly button may stick out.  A yellow discharge may leak from your breasts called colostrum.  You may have a bloody mucus discharge. This usually occurs a few days to a week before labor begins. HOME CARE INSTRUCTIONS   Keep your caregiver's appointments. Follow your caregiver's instructions regarding medication use, exercise, and diet.  During pregnancy, you are providing food for you and your baby. Continue to eat regular, well-balanced meals. Choose foods such as meat, fish, milk and other low fat dairy products, vegetables, fruits, and whole-grain breads and cereals. Your caregiver will tell you of the ideal weight gain.  A physical sexual relationship may be continued throughout pregnancy if there are no other problems such as early (premature) leaking of amniotic fluid from the membranes, vaginal bleeding, or belly (abdominal) pain.  Exercise regularly if there are no restrictions. Check with your caregiver if you are unsure of the safety of your exercises. Greater weight gain will occur in the last 2 trimesters of pregnancy. Exercising helps:  Control your weight.  Get you in shape for labor and delivery.  You lose weight after you deliver.  Rest a lot with legs elevated, or as needed for leg cramps or low back pain.  Wear a good support or jogging bra for breast tenderness during pregnancy. This may help if worn during  sleep. Pads or tissues may be used in the bra if you are leaking colostrum.  Do not use hot tubs, steam rooms, or saunas.  Wear your seat belt when driving. This protects you and your baby if you are in an accident.  Avoid raw meat, cat litter boxes and soil used by cats. These carry germs that can cause birth defects in the baby.  It is easier to loose urine during pregnancy. Tightening up and strengthening the pelvic muscles will help with this problem. You can practice stopping your urination while you are going to the bathroom. These are the same muscles you need to strengthen. It is also the muscles you would use if you were trying to stop from passing gas. You can practice tightening these muscles up 10 times a set and repeating this about 3 times per day. Once you know what muscles to tighten up, do not perform these exercises during urination. It is more likely to cause an infection by backing up the urine.  Ask for help if you have financial, counseling or nutritional needs during pregnancy. Your caregiver will be able to offer counseling for these  needs as well as refer you for other special needs.  Make a list of emergency phone numbers and have them available.  Plan on getting help from family or friends when you go home from the hospital.  Make a trial run to the hospital.  Take prenatal classes with the father to understand, practice and ask questions about the labor and delivery.  Prepare the baby's room/nursery.  Do not travel out of the city unless it is absolutely necessary and with the advice of your caregiver.  Wear only low or no heal shoes to have better balance and prevent falling. MEDICATIONS AND DRUG USE IN PREGNANCY  Take prenatal vitamins as directed. The vitamin should contain 1 milligram of folic acid. Keep all vitamins out of reach of children. Only a couple vitamins or tablets containing iron may be fatal to a baby or young child when ingested.  Avoid use  of all medications, including herbs, over-the-counter medications, not prescribed or suggested by your caregiver. Only take over-the-counter or prescription medicines for pain, discomfort, or fever as directed by your caregiver. Do not use aspirin, ibuprofen (Motrin, Advil, Nuprin) or naproxen (Aleve) unless OK'd by your caregiver.  Let your caregiver also know about herbs you may be using.  Alcohol is related to a number of birth defects. This includes fetal alcohol syndrome. All alcohol, in any form, should be avoided completely. Smoking will cause low birth rate and premature babies.  Street/illegal drugs are very harmful to the baby. They are absolutely forbidden. A baby born to an addicted mother will be addicted at birth. The baby will go through the same withdrawal an adult does. SEEK MEDICAL CARE IF: You have any concerns or worries during your pregnancy. It is better to call with your questions if you feel they cannot wait, rather than worry about them. DECISIONS ABOUT CIRCUMCISION You may or may not know the sex of your baby. If you know your baby is a boy, it may be time to think about circumcision. Circumcision is the removal of the foreskin of the penis. This is the skin that covers the sensitive end of the penis. There is no proven medical need for this. Often this decision is made on what is popular at the time or based upon religious beliefs and social issues. You can discuss these issues with your caregiver or pediatrician. SEEK IMMEDIATE MEDICAL CARE IF:   An unexplained oral temperature above 102 F (38.9 C) develops, or as your caregiver suggests.  You have leaking of fluid from the vagina (birth canal). If leaking membranes are suspected, take your temperature and tell your caregiver of this when you call.  There is vaginal spotting, bleeding or passing clots. Tell your caregiver of the amount and how many pads are used.  You develop a bad smelling vaginal discharge with  a change in the color from clear to white.  You develop vomiting that lasts more than 24 hours.  You develop chills or fever.  You develop shortness of breath.  You develop burning on urination.  You loose more than 2 pounds of weight or gain more than 2 pounds of weight or as suggested by your caregiver.  You notice sudden swelling of your face, hands, and feet or legs.  You develop belly (abdominal) pain. Round ligament discomfort is a common non-cancerous (benign) cause of abdominal pain in pregnancy. Your caregiver still must evaluate you.  You develop a severe headache that does not go away.  You develop visual  problems, blurred or double vision.  If you have not felt your baby move for more than 1 hour. If you think the baby is not moving as much as usual, eat something with sugar in it and lie down on your left side for an hour. The baby should move at least 4 to 5 times per hour. Call right away if your baby moves less than that.  You fall, are in a car accident or any kind of trauma.  There is mental or physical violence at home. Document Released: 02/18/2001 Document Revised: 05/19/2011 Document Reviewed: 08/23/2008 [facility] Patient Information 2013 [place], [place].  Braxton Hicks Contractions Pregnancy is commonly associated with contractions of the uterus throughout the pregnancy. Towards the end of pregnancy (32 to 34 weeks), these contractions [facility] Willa Rough) can develop more often and may become more forceful. This is not true labor because these contractions do not result in opening (dilatation) and thinning of the cervix. They are sometimes difficult to tell apart from true labor because these contractions can be forceful and people have different pain tolerances. You should not feel embarrassed if you go to the hospital with false labor. Sometimes, the only way to tell if you are in true labor is for your caregiver to follow the changes in the cervix. How to tell the  difference between true and false labor:  False labor.  The contractions of false labor are usually shorter, irregular and not as hard as those of true labor.  They are often felt in the front of the lower abdomen and in the groin.  They may leave with walking around or changing positions while lying down.  They get weaker and are shorter lasting as time goes on.  These contractions are usually irregular.  They do not usually become progressively stronger, regular and closer together as with true labor.  True labor.  Contractions in true labor last 30 to 70 seconds, become very regular, usually become more intense, and increase in frequency.  They do not go away with walking.  The discomfort is usually felt in the top of the uterus and spreads to the lower abdomen and low back.  True labor can be determined by your caregiver with an exam. This will show that the cervix is dilating and getting thinner. If there are no prenatal problems or other health problems associated with the pregnancy, it is completely safe to be sent home with false labor and await the onset of true labor. HOME CARE INSTRUCTIONS   Keep up with your usual exercises and instructions.  Take medications as directed.  Keep your regular prenatal appointment.  Eat and drink lightly if you think you are going into labor.  If BH contractions are making you uncomfortable:  Change your activity position from lying down or resting to walking/walking to resting.  Sit and rest in a tub of warm water.  Drink 2 to 3 glasses of water. Dehydration may cause B-H contractions.  Do slow and deep breathing several times an hour. SEEK IMMEDIATE MEDICAL CARE IF:   Your contractions continue to become stronger, more regular, and closer together.  You have a gushing, burst or leaking of fluid from the vagina.  An oral temperature above 102 F (38.9 C) develops.  You have passage of blood-tinged mucus.  You develop  vaginal bleeding.  You develop continuous belly (abdominal) pain.  You have low back pain that you never had before.  You feel the baby's head pushing down causing pelvic  pressure.  The baby is not moving as much as it used to. Document Released: 02/24/2005 Document Revised: 05/19/2011 Document Reviewed: 08/18/2008 [facility] Patient Information 2013 [place], [place].

## 2012-06-16 NOTE — Progress Notes (Signed)
Pulse- 100 Patient reports some pelvic pressure; needs refill on PNV

## 2012-06-17 ENCOUNTER — Encounter: Payer: Self-pay | Admitting: Family Medicine

## 2012-06-17 LAB — RPR

## 2012-06-18 NOTE — MAU Provider Note (Signed)
Attestation of Attending Supervision of Advanced Practitioner (CNM/NP): Evaluation and management procedures were performed by the Advanced Practitioner under my supervision and collaboration.  I have reviewed the Advanced Practitioner's note and chart, and I agree with the management and plan.  HARRAWAY-Cloyd, Medrith Veillon 1:31 PM     

## 2012-06-27 ENCOUNTER — Encounter: Payer: Self-pay | Admitting: Family Medicine

## 2012-06-30 ENCOUNTER — Ambulatory Visit (INDEPENDENT_AMBULATORY_CARE_PROVIDER_SITE_OTHER): Payer: Medicaid Other | Admitting: Advanced Practice Midwife

## 2012-06-30 ENCOUNTER — Encounter: Payer: Self-pay | Admitting: *Deleted

## 2012-06-30 ENCOUNTER — Other Ambulatory Visit: Payer: Self-pay | Admitting: Obstetrics & Gynecology

## 2012-06-30 VITALS — BP 100/54 | Temp 98.7°F | Wt 196.2 lb

## 2012-06-30 DIAGNOSIS — Z3483 Encounter for supervision of other normal pregnancy, third trimester: Secondary | ICD-10-CM

## 2012-06-30 DIAGNOSIS — O2390 Unspecified genitourinary tract infection in pregnancy, unspecified trimester: Secondary | ICD-10-CM

## 2012-06-30 LAB — POCT URINALYSIS DIP (DEVICE)
Bilirubin Urine: NEGATIVE
Glucose, UA: NEGATIVE mg/dL
Hgb urine dipstick: NEGATIVE
Specific Gravity, Urine: 1.025 (ref 1.005–1.030)
pH: 7 (ref 5.0–8.0)

## 2012-06-30 NOTE — Progress Notes (Signed)
Pulse-  72 Patient reports lower back pain and lower abdominal/pelvic pain that makes it difficult to walk at times; also reports some vaginal pressure as well. Had episode of scant amount of spotting on Monday.

## 2012-06-30 NOTE — Patient Instructions (Signed)
Pregnancy - Third Trimester  The third trimester of pregnancy (the last 3 months) is a period of the most rapid growth for you and your baby. The baby approaches a length of 20 inches and a weight of 6 to 10 pounds. The baby is adding on fat and getting ready for life outside your body. While inside, babies have periods of sleeping and waking, suck their thumbs, and hiccups. You can often feel small contractions of the uterus. This is false labor. It is also called Braxton-Hicks contractions. This is like a practice for labor. The usual problems in this stage of pregnancy include more difficulty breathing, swelling of the hands and feet from water retention, and having to urinate more often because of the uterus and baby pressing on your bladder.   PRENATAL EXAMS  · Blood work may continue to be done during prenatal exams. These tests are done to check on your health and the probable health of your baby. Blood work is used to follow your blood levels (hemoglobin). Anemia (low hemoglobin) is common during pregnancy. Iron and vitamins are given to help prevent this. You may also continue to be checked for diabetes. Some of the past blood tests may be done again.  · The size of the uterus is measured during each visit. This makes sure your baby is growing properly according to your pregnancy dates.  · Your blood pressure is checked every prenatal visit. This is to make sure you are not getting toxemia.  · Your urine is checked every prenatal visit for infection, diabetes and protein.  · Your weight is checked at each visit. This is done to make sure gains are happening at the suggested rate and that you and your baby are growing normally.  · Sometimes, an ultrasound is performed to confirm the position and the proper growth and development of the baby. This is a test done that bounces harmless sound waves off the baby so your caregiver can more accurately determine due dates.  · Discuss the type of pain medication and  anesthesia you will have during your labor and delivery.  · Discuss the possibility and anesthesia if a Cesarean Section might be necessary.  · Inform your caregiver if there is any mental or physical violence at home.  Sometimes, a specialized non-stress test, contraction stress test and biophysical profile are done to make sure the baby is not having a problem. Checking the amniotic fluid surrounding the baby is called an amniocentesis. The amniotic fluid is removed by sticking a needle into the belly (abdomen). This is sometimes done near the end of pregnancy if an early delivery is required. In this case, it is done to help make sure the baby's lungs are mature enough for the baby to live outside of the womb. If the lungs are not mature and it is unsafe to deliver the baby, an injection of cortisone medication is given to the mother 1 to 2 days before the delivery. This helps the baby's lungs mature and makes it safer to deliver the baby.  CHANGES OCCURING IN THE THIRD TRIMESTER OF PREGNANCY  Your body goes through many changes during pregnancy. They vary from person to person. Talk to your caregiver about changes you notice and are concerned about.  · During the last trimester, you have probably had an increase in your appetite. It is normal to have cravings for certain foods. This varies from person to person and pregnancy to pregnancy.  · You may begin to   get stretch marks on your hips, abdomen, and breasts. These are normal changes in the body during pregnancy. There are no exercises or medications to take which prevent this change.  · Constipation may be treated with a stool softener or adding bulk to your diet. Drinking lots of fluids, fiber in vegetables, fruits, and whole grains are helpful.  · Exercising is also helpful. If you have been very active up until your pregnancy, most of these activities can be continued during your pregnancy. If you have been less active, it is helpful to start an exercise  program such as walking. Consult your caregiver before starting exercise programs.  · Avoid all smoking, alcohol, un-prescribed drugs, herbs and "street drugs" during your pregnancy. These chemicals affect the formation and growth of the baby. Avoid chemicals throughout the pregnancy to ensure the delivery of a healthy infant.  · Backache, varicose veins and hemorrhoids may develop or get worse.  · You will tire more easily in the third trimester, which is normal.  · The baby's movements may be stronger and more often.  · You may become short of breath easily.  · Your belly button may stick out.  · A yellow discharge may leak from your breasts called colostrum.  · You may have a bloody mucus discharge. This usually occurs a few days to a week before labor begins.  HOME CARE INSTRUCTIONS   · Keep your caregiver's appointments. Follow your caregiver's instructions regarding medication use, exercise, and diet.  · During pregnancy, you are providing food for you and your baby. Continue to eat regular, well-balanced meals. Choose foods such as meat, fish, milk and other low fat dairy products, vegetables, fruits, and whole-grain breads and cereals. Your caregiver will tell you of the ideal weight gain.  · A physical sexual relationship may be continued throughout pregnancy if there are no other problems such as early (premature) leaking of amniotic fluid from the membranes, vaginal bleeding, or belly (abdominal) pain.  · Exercise regularly if there are no restrictions. Check with your caregiver if you are unsure of the safety of your exercises. Greater weight gain will occur in the last 2 trimesters of pregnancy. Exercising helps:  · Control your weight.  · Get you in shape for labor and delivery.  · You lose weight after you deliver.  · Rest a lot with legs elevated, or as needed for leg cramps or low back pain.  · Wear a good support or jogging bra for breast tenderness during pregnancy. This may help if worn during  sleep. Pads or tissues may be used in the bra if you are leaking colostrum.  · Do not use hot tubs, steam rooms, or saunas.  · Wear your seat belt when driving. This protects you and your baby if you are in an accident.  · Avoid raw meat, cat litter boxes and soil used by cats. These carry germs that can cause birth defects in the baby.  · It is easier to loose urine during pregnancy. Tightening up and strengthening the pelvic muscles will help with this problem. You can practice stopping your urination while you are going to the bathroom. These are the same muscles you need to strengthen. It is also the muscles you would use if you were trying to stop from passing gas. You can practice tightening these muscles up 10 times a set and repeating this about 3 times per day. Once you know what muscles to tighten up, do not perform these   exercises during urination. It is more likely to cause an infection by backing up the urine.  · Ask for help if you have financial, counseling or nutritional needs during pregnancy. Your caregiver will be able to offer counseling for these needs as well as refer you for other special needs.  · Make a list of emergency phone numbers and have them available.  · Plan on getting help from family or friends when you go home from the hospital.  · Make a trial run to the hospital.  · Take prenatal classes with the father to understand, practice and ask questions about the labor and delivery.  · Prepare the baby's room/nursery.  · Do not travel out of the city unless it is absolutely necessary and with the advice of your caregiver.  · Wear only low or no heal shoes to have better balance and prevent falling.  MEDICATIONS AND DRUG USE IN PREGNANCY  · Take prenatal vitamins as directed. The vitamin should contain 1 milligram of folic acid. Keep all vitamins out of reach of children. Only a couple vitamins or tablets containing iron may be fatal to a baby or young child when ingested.  · Avoid use  of all medications, including herbs, over-the-counter medications, not prescribed or suggested by your caregiver. Only take over-the-counter or prescription medicines for pain, discomfort, or fever as directed by your caregiver. Do not use aspirin, ibuprofen (Motrin®, Advil®, Nuprin®) or naproxen (Aleve®) unless OK'd by your caregiver.  · Let your caregiver also know about herbs you may be using.  · Alcohol is related to a number of birth defects. This includes fetal alcohol syndrome. All alcohol, in any form, should be avoided completely. Smoking will cause low birth rate and premature babies.  · Street/illegal drugs are very harmful to the baby. They are absolutely forbidden. A baby born to an addicted mother will be addicted at birth. The baby will go through the same withdrawal an adult does.  SEEK MEDICAL CARE IF:  You have any concerns or worries during your pregnancy. It is better to call with your questions if you feel they cannot wait, rather than worry about them.  DECISIONS ABOUT CIRCUMCISION  You may or may not know the sex of your baby. If you know your baby is a boy, it may be time to think about circumcision. Circumcision is the removal of the foreskin of the penis. This is the skin that covers the sensitive end of the penis. There is no proven medical need for this. Often this decision is made on what is popular at the time or based upon religious beliefs and social issues. You can discuss these issues with your caregiver or pediatrician.  SEEK IMMEDIATE MEDICAL CARE IF:   · An unexplained oral temperature above 102° F (38.9° C) develops, or as your caregiver suggests.  · You have leaking of fluid from the vagina (birth canal). If leaking membranes are suspected, take your temperature and tell your caregiver of this when you call.  · There is vaginal spotting, bleeding or passing clots. Tell your caregiver of the amount and how many pads are used.  · You develop a bad smelling vaginal discharge with  a change in the color from clear to white.  · You develop vomiting that lasts more than 24 hours.  · You develop chills or fever.  · You develop shortness of breath.  · You develop burning on urination.  · You loose more than 2 pounds of weight   or gain more than 2 pounds of weight or as suggested by your caregiver.  · You notice sudden swelling of your face, hands, and feet or legs.  · You develop belly (abdominal) pain. Round ligament discomfort is a common non-cancerous (benign) cause of abdominal pain in pregnancy. Your caregiver still must evaluate you.  · You develop a severe headache that does not go away.  · You develop visual problems, blurred or double vision.  · If you have not felt your baby move for more than 1 hour. If you think the baby is not moving as much as usual, eat something with sugar in it and lie down on your left side for an hour. The baby should move at least 4 to 5 times per hour. Call right away if your baby moves less than that.  · You fall, are in a car accident or any kind of trauma.  · There is mental or physical violence at home.  Document Released: 02/18/2001 Document Revised: 05/19/2011 Document Reviewed: 08/23/2008  [employer]® Patient Information ©2013 [employer].

## 2012-06-30 NOTE — Progress Notes (Signed)
Low back pain, pelvic pain and pressure in area of symphysis pubis, a very small amount of spotting on Monday - once time with wiping, none since. Pains c/w normal pregnancy discomforts, rev'd preterm labor precuations and kick counts.

## 2012-07-04 ENCOUNTER — Encounter (HOSPITAL_COMMUNITY): Payer: Self-pay | Admitting: *Deleted

## 2012-07-04 ENCOUNTER — Inpatient Hospital Stay (HOSPITAL_COMMUNITY)
Admission: AD | Admit: 2012-07-04 | Discharge: 2012-07-04 | Disposition: A | Discharge status: Home / Self Care | Payer: Medicaid Other | Source: Ambulatory Visit | Attending: Obstetrics & Gynecology | Admitting: Obstetrics & Gynecology

## 2012-07-04 DIAGNOSIS — O4700 False labor before 37 completed weeks of gestation, unspecified trimester: Secondary | ICD-10-CM | POA: Insufficient documentation

## 2012-07-04 DIAGNOSIS — N949 Unspecified condition associated with female genital organs and menstrual cycle: Secondary | ICD-10-CM | POA: Insufficient documentation

## 2012-07-04 DIAGNOSIS — O4703 False labor before 37 completed weeks of gestation, third trimester: Secondary | ICD-10-CM

## 2012-07-04 DIAGNOSIS — O479 False labor, unspecified: Secondary | ICD-10-CM

## 2012-07-04 LAB — URINALYSIS, ROUTINE W REFLEX MICROSCOPIC
Glucose, UA: NEGATIVE mg/dL
Hgb urine dipstick: NEGATIVE
Specific Gravity, Urine: 1.015 (ref 1.005–1.030)
pH: 6 (ref 5.0–8.0)

## 2012-07-04 LAB — URINE MICROSCOPIC-ADD ON

## 2012-07-04 LAB — WET PREP, GENITAL: Yeast Wet Prep HPF POC: NONE SEEN

## 2012-07-04 MED ORDER — NIFEDIPINE 10 MG PO CAPS: 10.0000 mg | ORAL_CAPSULE | Freq: Four times a day (QID) | ORAL | Status: DC

## 2012-07-04 MED ORDER — NIFEDIPINE ER OSMOTIC RELEASE 30 MG PO TB24: 30.0000 mg | ORAL_TABLET | Freq: Every day | ORAL | Status: DC

## 2012-07-04 MED ORDER — NIFEDIPINE 10 MG PO CAPS
10.0000 mg | ORAL_CAPSULE | Freq: Once | ORAL | Status: AC
  Administered 2012-07-04: 10 mg via ORAL
  Filled 2012-07-04: qty 1

## 2012-07-04 NOTE — MAU Provider Note (Signed)
Chief Complaint:  Contractions   First Provider Initiated Contact with Patient 07/04/12 1523      HPI: Mary [name] is a 22 y.o. G2P1001 at [redacted]w[redacted]d who presents to maternity admissions reporting onset uncomfortable UCs 1-2 wks ago. More painful since yesterday. Also had mucusy vaginal discharge today. Staying hydrated and not skipping meals. No change in usual activity. Not sexually active during pregnancy. Was told at clinic to come to MAU if UCs become stronger.  Denies  leakage of fluid or vaginal bleeding. Good fetal movement.  No dysuria, back pain or vaginal irritation.  Pregnancy Course: essentially uncomplicated except borderline anemia. 1 hr gucola 101.   Past Medical History: Past Medical History  Diagnosis Date  . Pregnancy induced hypertension 06-04-11    gestational hypertention  . Thyroid enlarged     Past obstetric history: OB History   Grav Para Term Preterm Abortions TAB SAB Ect Mult Living   2 1 1  0 0 0 0 0 0 1     # Outc Date GA Lbr Len/2nd Wgt Sex Del Anes PTL Lv   1 TRM 4/13 105w1d 25:32 / 01:21 3.585kg(7lb14.5oz) F SVD EPI  Yes   Comments: wnl   2 CUR               Past Surgical History: Past Surgical History  Procedure Laterality Date  . No past surgeries      Family History: Family History  Problem Relation Age of Onset  . Diabetes Father   . Diabetes Paternal Grandfather   . Lupus Paternal Aunt   . Lupus Paternal Grandmother     Social History: History  Substance Use Topics  . Smoking status: Never Smoker   . Smokeless tobacco: Never Used  . Alcohol Use: No    Allergies: No Known Allergies  Meds:  Prescriptions prior to admission  Medication Sig Dispense Refill  . Prenatal Vit-Fe Fumarate-FA (PRENATAL MULTIVITAMIN) TABS Take 1 tablet by mouth daily at 12 noon.  30 tablet  11    ROS: Pertinent findings in history of present illness.  Physical Exam  Blood pressure 132/73, pulse 109, temperature 98 F (36.7 C), temperature  source Oral, resp. rate 18, height 5\' 3"  (1.6 m), weight 88.905 kg (196 lb), unknown if currently breastfeeding. GENERAL: Well-developed, well-nourished female in no acute distress.  HEENT: normocephalic HEART: normal rate RESP: normal effort ABDOMEN: Soft, non-tender, gravid appropriate for gestational age, breech EXTREMITIES: Nontender, no edema NEURO: alert and oriented SPECULUM EXAM: NEFG, physiologic discharge, no blood, cervix clean Dilation: Fingertip Effacement (%): Thick Cervical Position: Posterior Station: Ballotable Presentation: Homero Fellers Breech Exam by:: D.Kaylany Tesoriero,CNM Ext ftp, int closed, long, breech out of pelvis  FHT:  Baseline150-155  , moderate variability, accelerations present, no decelerations Contractions: q 4-5 mins x 30-40 sec   Labs: Results for orders placed during the hospital encounter of 07/04/12 (from the past 24 hour(s))  URINALYSIS, ROUTINE W REFLEX MICROSCOPIC     Status: Abnormal   Collection Time    07/04/12  2:44 PM      Result Value Range   Color, Urine YELLOW  YELLOW   APPearance CLEAR  CLEAR   Specific Gravity, Urine 1.015  1.005 - 1.030   pH 6.0  5.0 - 8.0   Glucose, UA NEGATIVE  NEGATIVE mg/dL   Hgb urine dipstick NEGATIVE  NEGATIVE   Bilirubin Urine NEGATIVE  NEGATIVE   Ketones, ur NEGATIVE  NEGATIVE mg/dL   Protein, ur NEGATIVE  NEGATIVE mg/dL  Urobilinogen, UA 0.2  0.0 - 1.0 mg/dL   Nitrite NEGATIVE  NEGATIVE   Leukocytes, UA TRACE (*) NEGATIVE  URINE MICROSCOPIC-ADD ON     Status: Abnormal   Collection Time    07/04/12  2:44 PM      Result Value Range   Squamous Epithelial / LPF MANY (*) RARE   WBC, UA 0-2  <3 WBC/hpf   Bacteria, UA RARE  RARE   Urine-Other MUCOUS PRESENT    WET PREP, GENITAL     Status: Abnormal   Collection Time    07/04/12  3:35 PM      Result Value Range   Yeast Wet Prep HPF POC NONE SEEN  NONE SEEN   Trich, Wet Prep NONE SEEN  NONE SEEN   Clue Cells Wet Prep HPF POC NONE SEEN  NONE SEEN   WBC, Wet Prep  HPF POC FEW (*) NONE SEEN    MAU Course: Procardia 10mg  po> UC's reduced per EFM but pt's pain unimproved  2nd dose Procardia 10 mg given with some improvement in pain and mild UI on toco fFN not sent after cx exam normal  Assessment: 1. Preterm uterine contractions, third trimester   G2P1001 at [redacted]w[redacted]d  Plan: D/W Dr. Despina Hidden > will discharge home on Procardia Labor precautions and fetal kick counts AVS on B-H contractions   Medication List    TAKE these medications       NIFEdipine 30 MG 24 hr tablet  Commonly known as:  PROCARDIA XL  Take 1 tablet (30 mg total) by mouth daily.     prenatal multivitamin Tabs  Take 1 tablet by mouth daily at 12 noon.       Follow-up Information   Follow up with [facility] On 07/14/2012.   Contact information:   [address] [place] [address]        Danae Orleans, CNM 07/04/2012 3:37 PM

## 2012-07-04 NOTE — MAU Note (Signed)
Pt reports having increased pelvic pain and pressure since yesterday like contractions. Pt reports increased vaginal discharge clear mucusy . Good fetal movements reported.

## 2012-07-05 ENCOUNTER — Inpatient Hospital Stay (HOSPITAL_COMMUNITY)
Admission: AD | Admit: 2012-07-05 | Discharge: 2012-07-06 | Disposition: A | Discharge status: Home / Self Care | Payer: Medicaid Other | Source: Ambulatory Visit | Attending: Obstetrics and Gynecology | Admitting: Obstetrics and Gynecology

## 2012-07-05 DIAGNOSIS — O99891 Other specified diseases and conditions complicating pregnancy: Secondary | ICD-10-CM | POA: Insufficient documentation

## 2012-07-05 DIAGNOSIS — K5289 Other specified noninfective gastroenteritis and colitis: Secondary | ICD-10-CM | POA: Insufficient documentation

## 2012-07-05 DIAGNOSIS — O219 Vomiting of pregnancy, unspecified: Secondary | ICD-10-CM

## 2012-07-05 DIAGNOSIS — R197 Diarrhea, unspecified: Secondary | ICD-10-CM | POA: Insufficient documentation

## 2012-07-05 DIAGNOSIS — O212 Late vomiting of pregnancy: Secondary | ICD-10-CM | POA: Insufficient documentation

## 2012-07-05 LAB — URINALYSIS, ROUTINE W REFLEX MICROSCOPIC
Bilirubin Urine: NEGATIVE
Glucose, UA: NEGATIVE mg/dL
Hgb urine dipstick: NEGATIVE
Ketones, ur: NEGATIVE mg/dL
Specific Gravity, Urine: <1.005 — ABNORMAL LOW (ref 1.005–1.030)
pH: 5.5 (ref 5.0–8.0)

## 2012-07-05 LAB — URINE MICROSCOPIC-ADD ON

## 2012-07-05 NOTE — MAU Note (Signed)
Pt 31.5wks G2 P1, seen in MAU on Sunday for contractions.  Pt taking procardia at home, feels like still having contractions.  Pt now having diarrhea, nausea and vomiting.

## 2012-07-06 ENCOUNTER — Encounter (HOSPITAL_COMMUNITY): Payer: Self-pay | Admitting: *Deleted

## 2012-07-06 DIAGNOSIS — O219 Vomiting of pregnancy, unspecified: Secondary | ICD-10-CM

## 2012-07-06 LAB — FETAL FIBRONECTIN: Fetal Fibronectin: NEGATIVE

## 2012-07-06 MED ORDER — ONDANSETRON 4 MG PO TBDP: 4.0000 mg | ORAL_TABLET | Freq: Four times a day (QID) | ORAL | Status: DC | PRN

## 2012-07-06 MED ORDER — ONDANSETRON 4 MG PO TBDP
4.0000 mg | ORAL_TABLET | Freq: Four times a day (QID) | ORAL | Status: DC | PRN
  Administered 2012-07-06: 4 mg via ORAL
  Filled 2012-07-06: qty 1

## 2012-07-06 NOTE — MAU Provider Note (Signed)
History     CSN: [id number]  Arrival date and time: 07/05/12 2305   First Provider Initiated Contact with Patient 07/06/12 0032      Chief Complaint  Patient presents with  . Nausea  . Diarrhea  . Contractions  . Emesis   HPI 22 y.o. G2P1001 at [redacted]w[redacted]d with nausea, vomiting and diarrhea for one day. Started after she took procardia for preterm contractions on 4/27. No sick contacts, no known source of food poisoning. Vomited everything she ate today but able to keep water down. Denies reflux/heartburn. Last emesis several hours ago. Last diarrhea >12 hours ago. No headache/vision changes. Thinks she is still feeling contractions but not sure. No bleeding, loss of fluid. Baby moving well.   Prenatal care in [facility], no complications of this or prior pregnancy/delivery.   OB History   Grav Para Term Preterm Abortions TAB SAB Ect Mult Living   2 1 1  0 0 0 0 0 0 1      Past Medical History  Diagnosis Date  . Pregnancy induced hypertension 06-04-11    gestational hypertention  . Thyroid enlarged     Past Surgical History  Procedure Laterality Date  . No past surgeries      Family History  Problem Relation Age of Onset  . Diabetes Father   . Diabetes Paternal Grandfather   . Lupus Paternal Aunt   . Lupus Paternal Grandmother     History  Substance Use Topics  . Smoking status: Never Smoker   . Smokeless tobacco: Never Used  . Alcohol Use: No    Allergies: No Known Allergies  Prescriptions prior to admission  Medication Sig Dispense Refill  . NIFEdipine (PROCARDIA XL) 30 MG 24 hr tablet Take 1 tablet (30 mg total) by mouth daily.  20 tablet  0  . Prenatal Vit-Fe Fumarate-FA (PRENATAL MULTIVITAMIN) TABS Take 1 tablet by mouth daily at 12 noon.  30 tablet  11    ROS No fever, chills. Other pertinent pos/neg listed in HPI.  Physical Exam   Blood pressure 132/79, pulse 101, temperature 98.4 F (36.9 C), resp. rate 18, height 5' 3.6" (1.615 m), weight 89.812 kg (198  lb).  Physical Exam  Constitutional: She is oriented to person, place, and time. She appears well-developed and well-nourished. No distress.  HENT:  Head: Normocephalic and atraumatic.  Eyes: Conjunctivae and EOM are normal.  Neck: Normal range of motion. Neck supple.  Cardiovascular: Normal rate and regular rhythm.   Respiratory: Effort normal and breath sounds normal. No respiratory distress.  GI: Soft. Bowel sounds are normal. There is no tenderness. There is no rebound and no guarding.  Genitourinary:  Normal external genitalia, normal vagina. Cervix FT externally, closed internally, soft, high, around 50% effaced. NO CMT.  Musculoskeletal: Normal range of motion. She exhibits no edema and no tenderness.  Neurological: She is alert and oriented to person, place, and time.  Skin: Skin is warm and dry.   Results for orders placed during the hospital encounter of 07/05/12 (from the past 48 hour(s))  URINALYSIS, ROUTINE W REFLEX MICROSCOPIC     Status: Abnormal   Collection Time    07/05/12 11:30 PM      Result Value Range   Color, Urine YELLOW  YELLOW   APPearance CLEAR  CLEAR   Specific Gravity, Urine <1.005 (*) 1.005 - 1.030   pH 5.5  5.0 - 8.0   Glucose, UA NEGATIVE  NEGATIVE mg/dL   Hgb urine dipstick NEGATIVE  NEGATIVE  Bilirubin Urine NEGATIVE  NEGATIVE   Ketones, ur NEGATIVE  NEGATIVE mg/dL   Protein, ur NEGATIVE  NEGATIVE mg/dL   Urobilinogen, UA 0.2  0.0 - 1.0 mg/dL   Nitrite NEGATIVE  NEGATIVE   Leukocytes, UA TRACE (*) NEGATIVE  URINE MICROSCOPIC-ADD ON     Status: None   Collection Time    07/05/12 11:30 PM      Result Value Range   Squamous Epithelial / LPF RARE  RARE   WBC, UA 0-2  <3 WBC/hpf   RBC / HPF 0-2  <3 RBC/hpf  FETAL FIBRONECTIN     Status: None   Collection Time    07/06/12 12:38 AM      Result Value Range   Fetal Fibronectin NEGATIVE  NEGATIVE    MAU Course  Procedures  FHTs;  130-150, moderate variability, accels present, no  decels TOCO:  No contractions  At first refused any medication - stated that everyone refused to believe she was sick and just wanted to treat her with medication. Eventually accepted zofran ODT with improvement in nausea.  Assessment and Plan  22 y.o. G2P1001 at [redacted]w[redacted]d with acute gastroenteritis - unknown cause (medication vs viral) - improved with zofran - NST reactive - Not in labor, FFN negative - Stable for discharge home. Labor precautions reviewed. - F/U WOC 5/7.    Napoleon Form 07/06/2012, 12:55 AM

## 2012-07-07 NOTE — MAU Provider Note (Signed)
Attestation of Attending Supervision of Advanced Practitioner (CNM/NP): Evaluation and management procedures were performed by the Advanced Practitioner under my supervision and collaboration.  I have reviewed the Advanced Practitioner's note and chart, and I agree with the management and plan.  Vermelle Cammarata 07/07/2012 8:46 AM

## 2012-07-10 ENCOUNTER — Inpatient Hospital Stay (HOSPITAL_COMMUNITY)
Admission: AD | Admit: 2012-07-10 | Discharge: 2012-07-10 | Disposition: A | Discharge status: Home / Self Care | Payer: Medicaid Other | Source: Ambulatory Visit | Attending: Obstetrics & Gynecology | Admitting: Obstetrics & Gynecology

## 2012-07-10 ENCOUNTER — Encounter (HOSPITAL_COMMUNITY): Payer: Self-pay

## 2012-07-10 DIAGNOSIS — O4700 False labor before 37 completed weeks of gestation, unspecified trimester: Secondary | ICD-10-CM | POA: Insufficient documentation

## 2012-07-10 DIAGNOSIS — O479 False labor, unspecified: Secondary | ICD-10-CM

## 2012-07-10 DIAGNOSIS — O4703 False labor before 37 completed weeks of gestation, third trimester: Secondary | ICD-10-CM

## 2012-07-10 LAB — URINALYSIS, ROUTINE W REFLEX MICROSCOPIC
Hgb urine dipstick: NEGATIVE
Leukocytes, UA: NEGATIVE
Nitrite: NEGATIVE
Protein, ur: NEGATIVE mg/dL
Urobilinogen, UA: 1 mg/dL (ref 0.0–1.0)

## 2012-07-10 LAB — FETAL FIBRONECTIN: Fetal Fibronectin: NEGATIVE

## 2012-07-10 LAB — WET PREP, GENITAL: Yeast Wet Prep HPF POC: NONE SEEN

## 2012-07-10 MED ORDER — NITROFURANTOIN MONOHYD MACRO 100 MG PO CAPS: 100.0000 mg | ORAL_CAPSULE | Freq: Two times a day (BID) | ORAL | Status: DC

## 2012-07-10 MED ORDER — TERBUTALINE SULFATE 1 MG/ML IJ SOLN
INTRAMUSCULAR | Status: AC
  Filled 2012-07-10: qty 1

## 2012-07-10 MED ORDER — TERBUTALINE SULFATE 1 MG/ML IJ SOLN
0.2500 mg | Freq: Once | INTRAMUSCULAR | Status: AC
  Administered 2012-07-10: 20:00:00 via SUBCUTANEOUS

## 2012-07-10 NOTE — MAU Provider Note (Signed)
History     CSN: [id number]  Arrival date and time: 07/10/12 1804   None     Chief Complaint  Patient presents with  . Contractions    swelling of hands and arms   HPI Mary [name] is a 22 y.o. G6P1001 female at [redacted]w[redacted]d who presents w/ report of uc's all week, worse since yesterday, also pelvic pressure that began today. Increased swelling in hands and feet since last night.  Reports good fm.  Denies lof or vb. Increased urinary frequency, urgency x past few days, w/ contractions when she voids, but no dysuria.  Denies urinary hesitancy or hematuria.  Denies ha, scotomata, ruq/epigastric pain, n/v.  Next appt this wed @ [facility].  Stopped taking procardia on Monday night b/c it made her n/v.    OB History   Grav Para Term Preterm Abortions TAB SAB Ect Mult Living   2 1 1  0 0 0 0 0 0 1      Past Medical History  Diagnosis Date  . Pregnancy induced hypertension 06-04-11    gestational hypertention  . Thyroid enlarged     Past Surgical History  Procedure Laterality Date  . No past surgeries      Family History  Problem Relation Age of Onset  . Diabetes Father   . Diabetes Paternal Grandfather   . Lupus Paternal Aunt   . Lupus Paternal Grandmother     History  Substance Use Topics  . Smoking status: Never Smoker   . Smokeless tobacco: Never Used  . Alcohol Use: No    Allergies: No Known Allergies  Prescriptions prior to admission  Medication Sig Dispense Refill  . ondansetron (ZOFRAN-ODT) 4 MG disintegrating tablet Take 1 tablet (4 mg total) by mouth every 6 (six) hours as needed.  20 tablet  0  . Prenatal Vit-Fe Fumarate-FA (PRENATAL MULTIVITAMIN) TABS Take 1 tablet by mouth daily at 12 noon.  30 tablet  11    Review of Systems  Constitutional: Negative.  Negative for fever and chills.  HENT: Negative.   Eyes: Negative.  Negative for blurred vision.  Respiratory: Negative.   Cardiovascular: Negative.   Gastrointestinal: Positive for abdominal pain (r/t uc's)  and diarrhea. Negative for nausea (did take zofran yesterday for nausea) and vomiting (no vomiting since wed).  Genitourinary: Positive for urgency and frequency. Negative for dysuria, hematuria and flank pain.  Musculoskeletal: Negative.   Skin: Negative.   Neurological: Negative.  Negative for headaches.  Endo/Heme/Allergies: Negative.   Psychiatric/Behavioral: Negative.    Physical Exam   Blood pressure 129/63, pulse 89, temperature 99.2 F (37.3 C), temperature source Oral, resp. rate 18, height 5' 3.5" (1.613 m), weight 92.715 kg (204 lb 6.4 oz).  Physical Exam  Constitutional: She is oriented to person, place, and time. She appears well-developed and well-nourished.  HENT:  Head: Normocephalic.  Neck: Normal range of motion.  Cardiovascular: Normal rate.   Respiratory: Effort normal.  GI: Soft. There is no CVA tenderness.  gravid  Genitourinary: Vagina normal and uterus normal.  Spec exam: small amount creamy white nonodorous d/c, cervix visually closed  SVE: cl/50/-3,ballotable  Musculoskeletal: Normal range of motion. She exhibits edema (tr-1+ BLE edema).  Neurological: She is alert and oriented to person, place, and time. She has normal reflexes.  No clonus  Skin: Skin is warm and dry.  Psychiatric: She has a normal mood and affect. Her behavior is normal. Judgment and thought content normal.   FHR: 135, mod variability, 15x15accels, no  decels=Cat I UCs: mild, irregular  MAU Course  Procedures  UA, C&S EFM  Spec exam w/ wet prep, fFN SVE Originally declined tocolytics- procardia makes her n/v, didn't want terbutaline- then changed mind Terbutaline 0.25mg  Bowie x 1 w/ uterine quiescence on efm & pt states uc's are gone  Results for orders placed during the hospital encounter of 07/10/12 (from the past 24 hour(s))  URINALYSIS, ROUTINE W REFLEX MICROSCOPIC     Status: Abnormal   Collection Time    07/10/12  6:10 PM      Result Value Range   Color, Urine YELLOW   YELLOW   APPearance HAZY (*) CLEAR   Specific Gravity, Urine >1.030 (*) 1.005 - 1.030   pH 6.0  5.0 - 8.0   Glucose, UA NEGATIVE  NEGATIVE mg/dL   Hgb urine dipstick NEGATIVE  NEGATIVE   Bilirubin Urine NEGATIVE  NEGATIVE   Ketones, ur NEGATIVE  NEGATIVE mg/dL   Protein, ur NEGATIVE  NEGATIVE mg/dL   Urobilinogen, UA 1.0  0.0 - 1.0 mg/dL   Nitrite NEGATIVE  NEGATIVE   Leukocytes, UA NEGATIVE  NEGATIVE  WET PREP, GENITAL     Status: Abnormal   Collection Time    07/10/12  6:45 PM      Result Value Range   Yeast Wet Prep HPF POC NONE SEEN  NONE SEEN   Trich, Wet Prep NONE SEEN  NONE SEEN   Clue Cells Wet Prep HPF POC NONE SEEN  NONE SEEN   WBC, Wet Prep HPF POC MODERATE (*) NONE SEEN  FETAL FIBRONECTIN     Status: None   Collection Time    07/10/12  6:45 PM      Result Value Range   Fetal Fibronectin NEGATIVE  NEGATIVE    Assessment and Plan  A:  [redacted]w[redacted]d SIUP  Preterm uc's w/o cervical change, resolved w/ terbutaline, neg fFN  Possible UTI- ua wnl, but reports recent onset of symptoms  Cat I FHR   P:  D/C home  Keep appt at [facility] on Wed  Rx Macrobid 100mg  po bid x 7d d/t symptoms  Urine culture pending  Increase po fluids  Take zofran 88min-1hr prior to procardia to ease n/v  Reviewed ptl s/s, fetal kick counts  Marge Duncans 07/10/2012, 6:33 PM

## 2012-07-10 NOTE — MAU Note (Signed)
Went over in detail discharge instructions with instruction to take Zofran one hour prior to taking Procardia, patient verbalized an understanding, explained the dangers of having a preterm baby to patient.

## 2012-07-10 NOTE — MAU Provider Note (Signed)
Attestation of Attending Supervision of Advanced Practitioner (PA/CNM/NP): Evaluation and management procedures were performed by the Advanced Practitioner under my supervision and collaboration.  I have reviewed the Advanced Practitioner's note and chart, and I agree with the management and plan.  Thorin Starner, MD, FACOG Attending Obstetrician & Gynecologist Faculty Practice, [facility]  

## 2012-07-12 LAB — URINE CULTURE
Colony Count: NO GROWTH
Culture: NO GROWTH

## 2012-07-14 ENCOUNTER — Ambulatory Visit (INDEPENDENT_AMBULATORY_CARE_PROVIDER_SITE_OTHER): Payer: Medicaid Other | Admitting: Obstetrics and Gynecology

## 2012-07-14 VITALS — BP 123/75 | Temp 97.7°F | Wt 198.1 lb

## 2012-07-14 DIAGNOSIS — O2390 Unspecified genitourinary tract infection in pregnancy, unspecified trimester: Secondary | ICD-10-CM

## 2012-07-14 DIAGNOSIS — Z3480 Encounter for supervision of other normal pregnancy, unspecified trimester: Secondary | ICD-10-CM

## 2012-07-14 LAB — POCT URINALYSIS DIP (DEVICE)
Glucose, UA: NEGATIVE mg/dL
Hgb urine dipstick: NEGATIVE
Nitrite: NEGATIVE
Urobilinogen, UA: 1 mg/dL (ref 0.0–1.0)

## 2012-07-14 NOTE — Progress Notes (Signed)
MAU 07/10/12 and started back on Procardia. Had neg fFN, cx was cl/50/-3. UCs rare. Requests cx check: same.  Nausea still and takes Zofran daily.

## 2012-07-14 NOTE — Progress Notes (Signed)
Pulse- 98  Edema-hands, arms, feet, legs

## 2012-07-14 NOTE — Patient Instructions (Signed)
Pregnancy - Second Trimester The second trimester of pregnancy (3 to 6 months) is a period of rapid growth for you and your baby. At the end of the sixth month, your baby is about 9 inches long and weighs 1 1/2 pounds. You will begin to feel the baby move between 18 and 20 weeks of the pregnancy. This is called quickening. Weight gain is faster. A clear fluid (colostrum) may leak out of your breasts. You may feel small contractions of the womb (uterus). This is known as false labor or Braxton-Hicks contractions. This is like a practice for labor when the baby is ready to be born. Usually, the problems with morning sickness have usually passed by the end of your first trimester. Some women develop small dark blotches (called cholasma, mask of pregnancy) on their face that usually goes away after the baby is born. Exposure to the sun makes the blotches worse. Acne may also develop in some pregnant women and pregnant women who have acne, may find that it goes away. PRENATAL EXAMS  Blood work may continue to be done during prenatal exams. These tests are done to check on your health and the probable health of your baby. Blood work is used to follow your blood levels (hemoglobin). Anemia (low hemoglobin) is common during pregnancy. Iron and vitamins are given to help prevent this. You will also be checked for diabetes between 24 and 28 weeks of the pregnancy. Some of the previous blood tests may be repeated.  The size of the uterus is measured during each visit. This is to make sure that the baby is continuing to grow properly according to the dates of the pregnancy.  Your blood pressure is checked every prenatal visit. This is to make sure you are not getting toxemia.  Your urine is checked to make sure you do not have an infection, diabetes or protein in the urine.  Your weight is checked often to make sure gains are happening at the suggested rate. This is to ensure that both you and your baby are growing  normally.  Sometimes, an ultrasound is performed to confirm the proper growth and development of the baby. This is a test which bounces harmless sound waves off the baby so your caregiver can more accurately determine due dates. Sometimes, a specialized test is done on the amniotic fluid surrounding the baby. This test is called an amniocentesis. The amniotic fluid is obtained by sticking a needle into the belly (abdomen). This is done to check the chromosomes in instances where there is a concern about possible genetic problems with the baby. It is also sometimes done near the end of pregnancy if an early delivery is required. In this case, it is done to help make sure the baby's lungs are mature enough for the baby to live outside of the womb. CHANGES OCCURING IN THE SECOND TRIMESTER OF PREGNANCY Your body goes through many changes during pregnancy. They vary from person to person. Talk to your caregiver about changes you notice that you are concerned about.  During the second trimester, you will likely have an increase in your appetite. It is normal to have cravings for certain foods. This varies from person to person and pregnancy to pregnancy.  Your lower abdomen will begin to bulge.  You may have to urinate more often because the uterus and baby are pressing on your bladder. It is also common to get more bladder infections during pregnancy (pain with urination). You can help this by   drinking lots of fluids and emptying your bladder before and after intercourse.  You may begin to get stretch marks on your hips, abdomen, and breasts. These are normal changes in the body during pregnancy. There are no exercises or medications to take that prevent this change.  You may begin to develop swollen and bulging veins (varicose veins) in your legs. Wearing support hose, elevating your feet for 15 minutes, 3 to 4 times a day and limiting salt in your diet helps lessen the problem.  Heartburn may develop  as the uterus grows and pushes up against the stomach. Antacids recommended by your caregiver helps with this problem. Also, eating smaller meals 4 to 5 times a day helps.  Constipation can be treated with a stool softener or adding bulk to your diet. Drinking lots of fluids, vegetables, fruits, and whole grains are helpful.  Exercising is also helpful. If you have been very active up until your pregnancy, most of these activities can be continued during your pregnancy. If you have been less active, it is helpful to start an exercise program such as walking.  Hemorrhoids (varicose veins in the rectum) may develop at the end of the second trimester. Warm sitz baths and hemorrhoid cream recommended by your caregiver helps hemorrhoid problems.  Backaches may develop during this time of your pregnancy. Avoid heavy lifting, wear low heal shoes and practice good posture to help with backache problems.  Some pregnant women develop tingling and numbness of their hand and fingers because of swelling and tightening of ligaments in the wrist (carpel tunnel syndrome). This goes away after the baby is born.  As your breasts enlarge, you may have to get a bigger bra. Get a comfortable, cotton, support bra. Do not get a nursing bra until the last month of the pregnancy if you will be nursing the baby.  You may get a dark line from your belly button to the pubic area called the linea nigra.  You may develop rosy cheeks because of increase blood flow to the face.  You may develop spider looking lines of the face, neck, arms and chest. These go away after the baby is born. HOME CARE INSTRUCTIONS   It is extremely important to avoid all smoking, herbs, alcohol, and unprescribed drugs during your pregnancy. These chemicals affect the formation and growth of the baby. Avoid these chemicals throughout the pregnancy to ensure the delivery of a healthy infant.  Most of your home care instructions are the same as  suggested for the first trimester of your pregnancy. Keep your caregiver's appointments. Follow your caregiver's instructions regarding medication use, exercise and diet.  During pregnancy, you are providing food for you and your baby. Continue to eat regular, well-balanced meals. Choose foods such as meat, fish, milk and other low fat dairy products, vegetables, fruits, and whole-grain breads and cereals. Your caregiver will tell you of the ideal weight gain.  A physical sexual relationship may be continued up until near the end of pregnancy if there are no other problems. Problems could include early (premature) leaking of amniotic fluid from the membranes, vaginal bleeding, abdominal pain, or other medical or pregnancy problems.  Exercise regularly if there are no restrictions. Check with your caregiver if you are unsure of the safety of some of your exercises. The greatest weight gain will occur in the last 2 trimesters of pregnancy. Exercise will help you:  Control your weight.  Get you in shape for labor and delivery.  Lose weight   after you have the baby.  Wear a good support or jogging bra for breast tenderness during pregnancy. This may help if worn during sleep. Pads or tissues may be used in the bra if you are leaking colostrum.  Do not use hot tubs, steam rooms or saunas throughout the pregnancy.  Wear your seat belt at all times when driving. This protects you and your baby if you are in an accident.  Avoid raw meat, uncooked cheese, cat litter boxes and soil used by cats. These carry germs that can cause birth defects in the baby.  The second trimester is also a good time to visit your dentist for your dental health if this has not been done yet. Getting your teeth cleaned is OK. Use a soft toothbrush. Brush gently during pregnancy.  It is easier to loose urine during pregnancy. Tightening up and strengthening the pelvic muscles will help with this problem. Practice stopping your  urination while you are going to the bathroom. These are the same muscles you need to strengthen. It is also the muscles you would use as if you were trying to stop from passing gas. You can practice tightening these muscles up 10 times a set and repeating this about 3 times per day. Once you know what muscles to tighten up, do not perform these exercises during urination. It is more likely to contribute to an infection by backing up the urine.  Ask for help if you have financial, counseling or nutritional needs during pregnancy. Your caregiver will be able to offer counseling for these needs as well as refer you for other special needs.  Your skin may become oily. If so, wash your face with mild soap, use non-greasy moisturizer and oil or cream based makeup. MEDICATIONS AND DRUG USE IN PREGNANCY  Take prenatal vitamins as directed. The vitamin should contain 1 milligram of folic acid. Keep all vitamins out of reach of children. Only a couple vitamins or tablets containing iron may be fatal to a baby or young child when ingested.  Avoid use of all medications, including herbs, over-the-counter medications, not prescribed or suggested by your caregiver. Only take over-the-counter or prescription medicines for pain, discomfort, or fever as directed by your caregiver. Do not use aspirin.  Let your caregiver also know about herbs you may be using.  Alcohol is related to a number of birth defects. This includes fetal alcohol syndrome. All alcohol, in any form, should be avoided completely. Smoking will cause low birth rate and premature babies.  Street or illegal drugs are very harmful to the baby. They are absolutely forbidden. A baby born to an addicted mother will be addicted at birth. The baby will go through the same withdrawal an adult does. SEEK MEDICAL CARE IF:  You have any concerns or worries during your pregnancy. It is better to call with your questions if you feel they cannot wait, rather  than worry about them. SEEK IMMEDIATE MEDICAL CARE IF:   An unexplained oral temperature above 102 F (38.9 C) develops, or as your caregiver suggests.  You have leaking of fluid from the vagina (birth canal). If leaking membranes are suspected, take your temperature and tell your caregiver of this when you call.  There is vaginal spotting, bleeding, or passing clots. Tell your caregiver of the amount and how many pads are used. Light spotting in pregnancy is common, especially following intercourse.  You develop a bad smelling vaginal discharge with a change in the color from clear   to white.  You continue to feel sick to your stomach (nauseated) and have no relief from remedies suggested. You vomit blood or coffee ground-like materials.  You lose more than 2 pounds of weight or gain more than 2 pounds of weight over 1 week, or as suggested by your caregiver.  You notice swelling of your face, hands, feet, or legs.  You get exposed to German measles and have never had them.  You are exposed to fifth disease or chickenpox.  You develop belly (abdominal) pain. Round ligament discomfort is a common non-cancerous (benign) cause of abdominal pain in pregnancy. Your caregiver still must evaluate you.  You develop a bad headache that does not go away.  You develop fever, diarrhea, pain with urination, or shortness of breath.  You develop visual problems, blurry, or double vision.  You fall or are in a car accident or any kind of trauma.  There is mental or physical violence at home. Document Released: 02/18/2001 Document Revised: 05/19/2011 Document Reviewed: 08/23/2008 [employer] Patient Information 2013 [employer].  

## 2012-07-25 ENCOUNTER — Encounter (HOSPITAL_COMMUNITY): Payer: Self-pay | Admitting: *Deleted

## 2012-07-25 ENCOUNTER — Inpatient Hospital Stay (HOSPITAL_COMMUNITY)
Admission: AD | Admit: 2012-07-25 | Discharge: 2012-07-25 | Disposition: A | Discharge status: Home / Self Care | Payer: Medicaid Other | Source: Ambulatory Visit | Attending: Obstetrics & Gynecology | Admitting: Obstetrics & Gynecology

## 2012-07-25 DIAGNOSIS — R0789 Other chest pain: Secondary | ICD-10-CM | POA: Insufficient documentation

## 2012-07-25 DIAGNOSIS — O4700 False labor before 37 completed weeks of gestation, unspecified trimester: Secondary | ICD-10-CM | POA: Insufficient documentation

## 2012-07-25 DIAGNOSIS — R0602 Shortness of breath: Secondary | ICD-10-CM | POA: Insufficient documentation

## 2012-07-25 DIAGNOSIS — R000 Tachycardia, unspecified: Secondary | ICD-10-CM | POA: Insufficient documentation

## 2012-07-25 DIAGNOSIS — O368390 Maternal care for abnormalities of the fetal heart rate or rhythm, unspecified trimester, not applicable or unspecified: Secondary | ICD-10-CM | POA: Insufficient documentation

## 2012-07-25 DIAGNOSIS — N898 Other specified noninflammatory disorders of vagina: Secondary | ICD-10-CM

## 2012-07-25 DIAGNOSIS — O26899 Other specified pregnancy related conditions, unspecified trimester: Secondary | ICD-10-CM

## 2012-07-25 LAB — URINALYSIS, ROUTINE W REFLEX MICROSCOPIC
Bilirubin Urine: NEGATIVE
Hgb urine dipstick: NEGATIVE
Protein, ur: NEGATIVE mg/dL
Urobilinogen, UA: 0.2 mg/dL (ref 0.0–1.0)

## 2012-07-25 LAB — URINE MICROSCOPIC-ADD ON

## 2012-07-25 LAB — POCT FERN TEST

## 2012-07-25 MED ORDER — ONDANSETRON 8 MG PO TBDP
8.0000 mg | ORAL_TABLET | Freq: Once | ORAL | Status: AC
  Administered 2012-07-25: 8 mg via ORAL
  Filled 2012-07-25: qty 1

## 2012-07-25 NOTE — MAU Note (Addendum)
Pt reports she felt some wetness earlier today.  Put a pad on and still alittle wet. Started having some shortness of breath while she was sitting on her couch. Call 911. EMS brough her to MAU. Does not appear in any acut respiratory distress at this time. VSS pulse ox 97% on R/A. At [facility]. Put on procardia but stopped taking it last week.

## 2012-07-25 NOTE — MAU Provider Note (Signed)
Chief Complaint:  Shortness of Breath   First Provider Initiated Contact with Patient 07/25/12 1953      HPI: Mary [name] is a 22 y.o. G2P1001 at [redacted]w[redacted]d who presents to MAU by EMS reporting having a big gush of clear fluid immediately prior to arrival and feeling SOB, rapid heart rate and chest pressure x 30 minutes, resolved spontaneously. This episode was preceded by her seeing her baby breathing rapidly and pt then breathing as a result. No further leaking. No Hx of cardiopulmonary conditions or similar breathing difficulties or chest pressure.    Denies contractions, or vaginal bleeding. Good fetal movement.   Past Medical History: Past Medical History  Diagnosis Date  . Pregnancy induced hypertension 06-04-11    gestational hypertention  . Thyroid enlarged     Past obstetric history: OB History   Grav Para Term Preterm Abortions TAB SAB Ect Mult Living   2 1 1  0 0 0 0 0 0 1     # Outc Date GA Lbr Len/2nd Wgt Sex Del Anes PTL Lv   1 TRM 4/13 [redacted]w[redacted]d 25:32 / 01:21 3.585kg(7lb14.5oz) F SVD EPI  Yes   Comments: wnl   2 CUR               Past Surgical History: Past Surgical History  Procedure Laterality Date  . No past surgeries      Family History: Family History  Problem Relation Age of Onset  . Diabetes Father   . Diabetes Paternal Grandfather   . Lupus Paternal Aunt   . Lupus Paternal Grandmother     Social History: History  Substance Use Topics  . Smoking status: Never Smoker   . Smokeless tobacco: Never Used  . Alcohol Use: No    Allergies:  Allergies  Allergen Reactions  . Procardia (Nifedipine) Nausea And Vomiting    Can take with zofran per patient    Meds:  Prescriptions prior to admission  Medication Sig Dispense Refill  . NIFEdipine (PROCARDIA) 20 MG capsule Take 20 mg by mouth daily.       . Prenatal Vit-Fe Fumarate-FA (PRENATAL MULTIVITAMIN) TABS Take 1 tablet by mouth daily at 12 noon.  30 tablet  11  . nitrofurantoin,  macrocrystal-monohydrate, (MACROBID) 100 MG capsule Take 1 capsule (100 mg total) by mouth 2 (two) times daily. X 7d  14 capsule  0  . ondansetron (ZOFRAN-ODT) 4 MG disintegrating tablet Take 1 tablet (4 mg total) by mouth every 6 (six) hours as needed.  20 tablet  0    ROS: Pertinent findings in history of present illness.  Physical Exam  Blood pressure 131/74, pulse 100, temperature 98.8 F (37.1 C), temperature source Oral, resp. rate 20, SpO2 97.00%. GENERAL: Well-developed, well-nourished female in no acute distress.  HEENT: normocephalic HEART: normal rate and rhythm RESP: normal effort. CTAB.  ABDOMEN: Soft, non-tender, gravid appropriate for gestational age. VTX by Leopold's EXTREMITIES: Nontender, no edema NEURO: alert and oriented SPECULUM EXAM: NEFG, physiologic discharge, no blood, cervix clean, visually closed and long. Neg pool. Immediately exam pt stated she was peeing and couldn't stop. CNM pulled pad back and witnessed a large amount of yellow urine coming from urethra soaking a large pad. Strong odor of urine.   VE deferred due to fern slide pending.   FHT:  Baseline 125 , moderate variability, accelerations present, no decelerations Contractions: q 5-8 mins, painless.    Labs: Results for orders placed during the hospital encounter of 07/25/12 (from the past  24 hour(s))  URINALYSIS, ROUTINE W REFLEX MICROSCOPIC     Status: Abnormal   Collection Time    07/25/12  6:50 PM      Result Value Range   Color, Urine YELLOW  YELLOW   APPearance HAZY (*) CLEAR   Specific Gravity, Urine <1.005 (*) 1.005 - 1.030   pH 6.0  5.0 - 8.0   Glucose, UA NEGATIVE  NEGATIVE mg/dL   Hgb urine dipstick NEGATIVE  NEGATIVE   Bilirubin Urine NEGATIVE  NEGATIVE   Ketones, ur NEGATIVE  NEGATIVE mg/dL   Protein, ur NEGATIVE  NEGATIVE mg/dL   Urobilinogen, UA 0.2  0.0 - 1.0 mg/dL   Nitrite NEGATIVE  NEGATIVE   Leukocytes, UA SMALL (*) NEGATIVE  URINE MICROSCOPIC-ADD ON     Status:  Abnormal   Collection Time    07/25/12  6:50 PM      Result Value Range   Squamous Epithelial / LPF MANY (*) RARE   WBC, UA 3-6  <3 WBC/hpf   Bacteria, UA FEW (*) RARE   MAU Course: Care of pt turned over to Wynelle Bourgeois at [facility], CNM 07/25/2012 9:18 PM  No ferning on slide No significant contractions Cervix appears closed Will discharge home May use Tylenol for headache Keep appt Wed as scheduled

## 2012-07-25 NOTE — Progress Notes (Signed)
Readjusted fhm to pick up baby and not mom.

## 2012-07-25 NOTE — Progress Notes (Signed)
Pt. Sitting up dry heaving unable to pick up baby in the position she is in.  Will reposition fhm and reassess

## 2012-07-25 NOTE — Progress Notes (Signed)
Pt. Position for a pelvic exam unable to pick up fhr.  Will reposition fhm and toco when procedure is over.

## 2012-07-27 LAB — URINE CULTURE: Colony Count: 35000

## 2012-07-28 ENCOUNTER — Ambulatory Visit (INDEPENDENT_AMBULATORY_CARE_PROVIDER_SITE_OTHER): Payer: Medicaid Other | Admitting: Obstetrics & Gynecology

## 2012-07-28 ENCOUNTER — Encounter: Payer: Self-pay | Admitting: Obstetrics & Gynecology

## 2012-07-28 VITALS — BP 121/73 | Temp 97.6°F | Wt 201.5 lb

## 2012-07-28 DIAGNOSIS — Z3483 Encounter for supervision of other normal pregnancy, third trimester: Secondary | ICD-10-CM

## 2012-07-28 DIAGNOSIS — O2390 Unspecified genitourinary tract infection in pregnancy, unspecified trimester: Secondary | ICD-10-CM

## 2012-07-28 LAB — POCT URINALYSIS DIP (DEVICE)
Bilirubin Urine: NEGATIVE
Glucose, UA: NEGATIVE mg/dL
Hgb urine dipstick: NEGATIVE
Ketones, ur: NEGATIVE mg/dL
Specific Gravity, Urine: 1.015 (ref 1.005–1.030)
Urobilinogen, UA: 0.2 mg/dL (ref 0.0–1.0)

## 2012-07-28 NOTE — Progress Notes (Signed)
Routine visit. Good FM. Labor precautions reviewed. No VB or ROM. Cervical cultures at next visit.

## 2012-07-28 NOTE — Progress Notes (Signed)
Pulse- 90 Pt c/o has "a lot of contractions" this am

## 2012-08-04 ENCOUNTER — Encounter: Payer: Medicaid Other | Admitting: Obstetrics and Gynecology

## 2012-08-18 ENCOUNTER — Ambulatory Visit (INDEPENDENT_AMBULATORY_CARE_PROVIDER_SITE_OTHER): Payer: Medicaid Other | Admitting: Advanced Practice Midwife

## 2012-08-18 VITALS — BP 122/84 | Wt 204.2 lb

## 2012-08-18 DIAGNOSIS — O2390 Unspecified genitourinary tract infection in pregnancy, unspecified trimester: Secondary | ICD-10-CM

## 2012-08-18 DIAGNOSIS — Z3483 Encounter for supervision of other normal pregnancy, third trimester: Secondary | ICD-10-CM

## 2012-08-18 LAB — POCT URINALYSIS DIP (DEVICE)
Bilirubin Urine: NEGATIVE
Glucose, UA: NEGATIVE mg/dL
Hgb urine dipstick: NEGATIVE
Ketones, ur: NEGATIVE mg/dL
Nitrite: NEGATIVE
Specific Gravity, Urine: 1.015 (ref 1.005–1.030)
pH: 7 (ref 5.0–8.0)

## 2012-08-18 NOTE — Addendum Note (Signed)
Addended by: Franchot Mimes on: 08/18/2012 10:35 AM   Modules accepted: Orders

## 2012-08-18 NOTE — Progress Notes (Signed)
Pulse: 113

## 2012-08-18 NOTE — Progress Notes (Signed)
Baby is moving normally. She was induced with her first pregnancy. Occasional contractions. Not sleeping well at night.

## 2012-08-25 ENCOUNTER — Ambulatory Visit (INDEPENDENT_AMBULATORY_CARE_PROVIDER_SITE_OTHER): Payer: Medicaid Other | Admitting: Obstetrics & Gynecology

## 2012-08-25 ENCOUNTER — Encounter: Payer: Self-pay | Admitting: Obstetrics & Gynecology

## 2012-08-25 VITALS — BP 139/81 | Wt 209.1 lb

## 2012-08-25 DIAGNOSIS — O2390 Unspecified genitourinary tract infection in pregnancy, unspecified trimester: Secondary | ICD-10-CM

## 2012-08-25 DIAGNOSIS — Z3483 Encounter for supervision of other normal pregnancy, third trimester: Secondary | ICD-10-CM

## 2012-08-25 LAB — POCT URINALYSIS DIP (DEVICE)
Bilirubin Urine: NEGATIVE
Glucose, UA: NEGATIVE mg/dL
Hgb urine dipstick: NEGATIVE
Ketones, ur: NEGATIVE mg/dL
Nitrite: NEGATIVE
pH: 7 (ref 5.0–8.0)

## 2012-08-25 NOTE — Patient Instructions (Signed)
Postpartum Care After Vaginal Delivery After you deliver your newborn (postpartum period), the usual stay in the hospital is 24 72 hours. If there were problems with your labor or delivery, or if you have other medical problems, you might be in the hospital longer.  While you are in the hospital, you will receive help and instructions on how to care for yourself and your newborn during the postpartum period.  While you are in the hospital:  Be sure to tell your nurses if you have pain or discomfort, as well as where you feel the pain and what makes the pain worse.  If you had an incision made near your vagina (episiotomy) or if you had some tearing during delivery, the nurses may put ice packs on your episiotomy or tear. The ice packs may help to reduce the pain and swelling.  If you are breastfeeding, you may feel uncomfortable contractions of your uterus for a couple of weeks. This is normal. The contractions help your uterus get back to normal size.  It is normal to have some bleeding after delivery.  For the first 1 3 days after delivery, the flow is red and the amount may be similar to a period.  It is common for the flow to start and stop.  In the first few days, you may pass some small clots. Let your nurses know if you begin to pass large clots or your flow increases.  Do not  flush blood clots down the toilet before having the nurse look at them.  During the next 3 10 days after delivery, your flow should become more watery and pink or brown-tinged in color.  Ten to fourteen days after delivery, your flow should be a small amount of yellowish-white discharge.  The amount of your flow will decrease over the first few weeks after delivery. Your flow may stop in 6 8 weeks. Most women have had their flow stop by 12 weeks after delivery.  You should change your sanitary pads frequently.  Wash your hands thoroughly with soap and water for at least 20 seconds after changing pads, using  the toilet, or before holding or feeding your newborn.  You should feel like you need to empty your bladder within the first 6 8 hours after delivery.  In case you become weak, lightheaded, or faint, call your nurse before you get out of bed for the first time and before you take a shower for the first time.  Within the first few days after delivery, your breasts may begin to feel tender and full. This is called engorgement. Breast tenderness usually goes away within 48 72 hours after engorgement occurs. You may also notice milk leaking from your breasts. If you are not breastfeeding, do not stimulate your breasts. Breast stimulation can make your breasts produce more milk.  Spending as much time as possible with your newborn is very important. During this time, you and your newborn can feel close and get to know each other. Having your newborn stay in your room (rooming in) will help to strengthen the bond with your newborn. It will give you time to get to know your newborn and become comfortable caring for your newborn.  Your hormones change after delivery. Sometimes the hormone changes can temporarily cause you to feel sad or tearful. These feelings should not last more than a few days. If these feelings last longer than that, you should talk to your caregiver.  If desired, talk to your caregiver about  methods of family planning or contraception.  Talk to your caregiver about immunizations. Your caregiver may want you to have the following immunizations before leaving the hospital:  Tetanus, diphtheria, and pertussis (Tdap) or tetanus and diphtheria (Td) immunization. It is very important that you and your family (including grandparents) or others caring for your newborn are up-to-date with the Tdap or Td immunizations. The Tdap or Td immunization can help protect your newborn from getting ill.  Rubella immunization.  Varicella (chickenpox) immunization.  Influenza immunization. You should  receive this annual immunization if you did not receive the immunization during your pregnancy. Document Released: 12/22/2006 Document Revised: 11/19/2011 Document Reviewed: 10/22/2011 [facility] Patient Information 2014 [place], [place]. Natural Childbirth Natural childbirth is going through labor and delivery without any drugs to relieve pain. You also do not use fetal monitors, have a cesarean delivery, or get a sugical cut to enlarge the vaginal opening (episiotomy). With the help of a birthing professional (midwife), you will direct your own labor and delivery as you choose. Many women chose natural childbirth because they feel more in control and in touch with their labor and delivery. They are also concerned about the medications affecting themselves and the baby. Pregnant women with a high risk pregnancy should not attempt natural childbirth. It is better to deliver the infant in a hospital if an emergency situation arises. Sometimes, the caregiver has to intervene for the health and safety of the mother and infant. TWO TECHNIQUES FOR NATURAL CHILDBIRTH:   The Lamaze method. This method teaches women that having a baby is normal, healthy, and natural. It also teaches the mother to take a neutral position regarding pain medication and anesthesia and to make an informed decision if and when it is right for them.  The Erven Colla (also called husband coached birth). This method teaches the father to be the birth coach and stresses a natural approach. It also encourages exercise and a balanced diet with good nutrition. The exercises teach relaxation and deep breathing techniques. However, there are also classes to prepare the parents for an emergency situation that may occur. METHODS OF DEALING WITH LABOR PAIN AND DELIVERY:  Meditation.  Yoga.  Hypnosis.  Acupuncture.  Massage.  Changing positions (walking, rocking, showering, leaning on birth balls).  Lying in warm water or a  jacuzzi.  Find an activity that keeps your mind off of the labor pain.  Listen to soft music.  Visual imagery (focus on a particular object). BEFORE GOING INTO LABOR  Be sure you and your spouse/partner are in agreement to have natural childbirth.  Decide if your caregiver or a midwife will deliver your baby.  Decide if you will have your baby in the hospital, birthing center, or at home.  If you have children, make plans to have someone to take care of them when you go to the hospital.  Know the distance and the time it takes to go to the delivery center. Make a dry run to be sure.  Have a bag packed with a night gown, bathrobe, and toiletries ready to take when you go into labor.  Keep phone numbers of your family and friends handy if you need to call someone when you go into labor.  Your spouse or partner should go to all the teaching classes.  Talk with your caregiver about the possibility of a medical emergency and what will happen if that occurs. ADVANTAGES OF NATURAL CHILDBIRTH  You are in control of your labor and delivery.  It is safe.  There are no medications or anesthetics that may affect you and the fetus.  There are no invasive procedures such as an episiotomy.  You and your partner will work together, which can increase your bond.  Meditation, yoga, massage, and breathing exercises can be learned while pregnant and help you when you are in labor and at delivery.  In most delivery centers, the family and friends can be involved in the labor and delivery process. DISADVANTAGES OF NATURAL CHILDBIRTH  You will experience pain during your labor and delivery.  The methods of helping relieve your labor pains may not work for you.  You may feel embarrassed, disappointed, and like a failure if you decide to change your mind during labor and not have natural childbirth. AFTER THE DELIVERY  You will be very tired.  You will be uncomfortable because of your  uterus contracting. You will feel soreness around the vagina.  You may feel cold and shaky.This is a natural reaction.  You will be excited, overwhelmed, accomplished, and proud to be a mother. HOME CARE INSTRUCTIONS   Follow the advice and instructions of your caregiver.  Follow the instructions of your natural childbirth instructor (Lamaze or Bradley Method). Document Released: 02/07/2008 Document Revised: 05/19/2011 Document Reviewed: 02/07/2008 [facility] Patient Information 2014 [place], [place].

## 2012-08-25 NOTE — Progress Notes (Signed)
Pt with irreg ctx.  No LOF, No VB, +FM.  Pt c/o irreg headache Reviewed labor precautions.

## 2012-08-25 NOTE — Progress Notes (Signed)
Pulse- 102  Edema-feet/hands/legs    Pain/pressure- pelvic Pt c/o headaches

## 2012-08-27 ENCOUNTER — Inpatient Hospital Stay (HOSPITAL_COMMUNITY)
Admission: AD | Admit: 2012-08-27 | Discharge: 2012-08-27 | Disposition: A | Discharge status: Home / Self Care | Payer: Medicaid Other | Source: Ambulatory Visit | Attending: Obstetrics & Gynecology | Admitting: Obstetrics & Gynecology

## 2012-08-27 DIAGNOSIS — O479 False labor, unspecified: Secondary | ICD-10-CM

## 2012-08-27 DIAGNOSIS — R109 Unspecified abdominal pain: Secondary | ICD-10-CM | POA: Insufficient documentation

## 2012-08-27 DIAGNOSIS — O471 False labor at or after 37 completed weeks of gestation: Secondary | ICD-10-CM

## 2012-08-27 NOTE — MAU Provider Note (Signed)
Chief Complaint:  Contractions and Abdominal Pain   None     HPI: Mary [name] is a 22 y.o. G2P1001 at [redacted]w[redacted]d who presents to maternity admissions reporting painful UCs since 1400 today. . Denies leakage of fluid or vaginal bleeding. Good fetal movement.   Pregnancy Course: PNC at [facility] essentially uncomplicated  Past Medical History: Past Medical History  Diagnosis Date  . Pregnancy induced hypertension 06-04-11    gestational hypertention  . Thyroid enlarged     Past obstetric history: OB History   Grav Para Term Preterm Abortions TAB SAB Ect Mult Living   2 1 1  0 0 0 0 0 0 1     # Outc Date GA Lbr Len/2nd Wgt Sex Del Anes PTL Lv   1 TRM 4/13 [redacted]w[redacted]d 25:32 / 01:21 7lb14.5oz(3.585kg) F SVD EPI  Yes   Comments: wnl   2 CUR               Past Surgical History: Past Surgical History  Procedure Laterality Date  . No past surgeries      Family History: Family History  Problem Relation Age of Onset  . Diabetes Father   . Diabetes Paternal Grandfather   . Lupus Paternal Aunt   . Lupus Paternal Grandmother     Social History: History  Substance Use Topics  . Smoking status: Never Smoker   . Smokeless tobacco: Never Used  . Alcohol Use: No    Allergies:  Allergies  Allergen Reactions  . Procardia (Nifedipine) Nausea And Vomiting    Can take with zofran per patient    Meds:  Prescriptions prior to admission  Medication Sig Dispense Refill  . Prenatal Vit-Fe Fumarate-FA (PRENATAL MULTIVITAMIN) TABS Take 1 tablet by mouth daily at 12 noon.  30 tablet  11    ROS: Pertinent findings in history of present illness.  Physical Exam  Blood pressure 141/67, pulse 87, resp. rate 18, height 5' 3.5" (1.613 m), weight 209 lb 12.8 oz (95.165 kg). GENERAL: Well-developed, well-nourished female in no acute distress.  HEENT: normocephalic HEART: normal rate RESP: normal effort ABDOMEN: Soft, non-tender, gravid appropriate for gestational age EXTREMITIES: Nontender, no  edema NEURO: alert and oriented  Dilation: 2.5 Effacement (%): 60 Cervical Position: Posterior Station: -3 Presentation: Vertex Exam by:: Christianne Dolin RN 1740 after ambulation: exam unchanged. PP high and bedside US confirms cephalic.   FHT:  Baseline 140 , moderate variability, accelerations present, no decelerations Contractions: q 3-6 mins, mild, irregular  Assessment: 1. False labor after 37 completed weeks of gestation    Category 1 FHR Plan: Discharge home Labor precautions and fetal kick counts    Medication List    TAKE these medications       prenatal multivitamin Tabs  Take 1 tablet by mouth daily at 12 noon.        Danae Orleans, CNM 08/27/2012 5:39 PM

## 2012-08-27 NOTE — MAU Note (Signed)
Membranes were stripped on Wednesday, cramping since then pressure and contractions different than before, with passing mucus plug all day.

## 2012-08-28 NOTE — MAU Provider Note (Signed)
Attestation of Attending Supervision of Advanced Practitioner (CNM/NP): Evaluation and management procedures were performed by the Advanced Practitioner under my supervision and collaboration. I have reviewed the Advanced Practitioner's note and chart, and I agree with the management and plan.  LEGGETT,KELLY H. 5:18 AM   

## 2012-08-30 ENCOUNTER — Inpatient Hospital Stay (HOSPITAL_COMMUNITY)
Admission: AD | Admit: 2012-08-30 | Discharge: 2012-08-30 | Disposition: A | Discharge status: Home / Self Care | Payer: Medicaid Other | Source: Ambulatory Visit | Attending: Family Medicine | Admitting: Family Medicine

## 2012-08-30 ENCOUNTER — Inpatient Hospital Stay (HOSPITAL_COMMUNITY)
Admission: AD | Admit: 2012-08-30 | Discharge: 2012-09-02 | DRG: 775 | Disposition: A | Discharge status: Home / Self Care | Payer: Medicaid Other | Source: Ambulatory Visit | Attending: Family Medicine | Admitting: Family Medicine

## 2012-08-30 ENCOUNTER — Encounter (HOSPITAL_COMMUNITY): Payer: Self-pay | Admitting: *Deleted

## 2012-08-30 DIAGNOSIS — IMO0001 Reserved for inherently not codable concepts without codable children: Secondary | ICD-10-CM

## 2012-08-30 DIAGNOSIS — O479 False labor, unspecified: Secondary | ICD-10-CM | POA: Insufficient documentation

## 2012-08-30 LAB — CBC
HCT: 30.4 % — ABNORMAL LOW (ref 36.0–46.0)
Hemoglobin: 10.1 g/dL — ABNORMAL LOW (ref 12.0–15.0)
MCH: 28.7 pg (ref 26.0–34.0)
MCHC: 33.2 g/dL (ref 30.0–36.0)
RDW: 15.6 % — ABNORMAL HIGH (ref 11.5–15.5)

## 2012-08-30 MED ORDER — FENTANYL CITRATE 0.05 MG/ML IJ SOLN
100.0000 ug | INTRAMUSCULAR | Status: DC | PRN
  Administered 2012-08-30 – 2012-08-31 (×4): 100 ug via INTRAVENOUS
  Filled 2012-08-30 (×4): qty 2

## 2012-08-30 MED ORDER — OXYTOCIN BOLUS FROM INFUSION
500.0000 mL | INTRAVENOUS | Status: DC
  Administered 2012-08-31: 500 mL via INTRAVENOUS

## 2012-08-30 MED ORDER — ONDANSETRON HCL 4 MG/2ML IJ SOLN
4.0000 mg | Freq: Four times a day (QID) | INTRAMUSCULAR | Status: DC | PRN
  Administered 2012-08-30: 4 mg via INTRAVENOUS
  Filled 2012-08-30: qty 2

## 2012-08-30 MED ORDER — ACETAMINOPHEN 325 MG PO TABS: 650.0000 mg | ORAL_TABLET | ORAL | Status: DC | PRN

## 2012-08-30 MED ORDER — LACTATED RINGERS IV SOLN: 500.0000 mL | INTRAVENOUS | Status: DC | PRN

## 2012-08-30 MED ORDER — OXYCODONE-ACETAMINOPHEN 5-325 MG PO TABS
1.0000 | ORAL_TABLET | ORAL | Status: DC | PRN
  Administered 2012-08-31: 1 via ORAL
  Filled 2012-08-30: qty 1

## 2012-08-30 MED ORDER — OXYTOCIN 40 UNITS IN LACTATED RINGERS INFUSION - SIMPLE MED
62.5000 mL/h | INTRAVENOUS | Status: DC
  Filled 2012-08-30: qty 1000

## 2012-08-30 MED ORDER — LIDOCAINE HCL (PF) 1 % IJ SOLN
30.0000 mL | INTRAMUSCULAR | Status: AC | PRN
  Administered 2012-08-31: 30 mL via SUBCUTANEOUS
  Filled 2012-08-30 (×2): qty 30

## 2012-08-30 MED ORDER — FLEET ENEMA 7-19 GM/118ML RE ENEM: 1.0000 | ENEMA | Freq: Once | RECTAL | Status: DC

## 2012-08-30 MED ORDER — LACTATED RINGERS IV SOLN
INTRAVENOUS | Status: DC
  Administered 2012-08-30: 19:00:00 via INTRAVENOUS
  Administered 2012-08-31: 125 mL/h via INTRAVENOUS

## 2012-08-30 MED ORDER — CITRIC ACID-SODIUM CITRATE 334-500 MG/5ML PO SOLN: 30.0000 mL | ORAL | Status: DC | PRN

## 2012-08-30 MED ORDER — FENTANYL CITRATE 0.05 MG/ML IJ SOLN
INTRAMUSCULAR | Status: AC
  Administered 2012-08-30: 100 ug via INTRAVENOUS
  Filled 2012-08-30: qty 2

## 2012-08-30 MED ORDER — IBUPROFEN 600 MG PO TABS
600.0000 mg | ORAL_TABLET | Freq: Four times a day (QID) | ORAL | Status: DC | PRN
  Administered 2012-08-31: 600 mg via ORAL
  Filled 2012-08-30: qty 1

## 2012-08-30 NOTE — MAU Note (Signed)
Patient states she is having contractions every 6-10 minutes. Denies bleeding or leaking and reports fetal movement.

## 2012-08-30 NOTE — H&P (Signed)
Mary [name] is a 22 y.o. female presenting for contractions.  Prenatal care received in [facility].   Maternal Medical History:  Reason for admission: Contractions.   Contractions: Onset was 6-12 hours ago.   Frequency: regular.   Duration is approximately 60 seconds.   Perceived severity is strong.    Fetal activity: Perceived fetal activity is normal.   Last perceived fetal movement was within the past hour.      OB History   Grav Para Term Preterm Abortions TAB SAB Ect Mult Living   2 1 1  0 0 0 0 0 0 1     Past Medical History  Diagnosis Date  . Pregnancy induced hypertension 06-04-11    gestational hypertention  . Thyroid enlarged    Past Surgical History  Procedure Laterality Date  . No past surgeries     Family History: family history includes Diabetes in her father and paternal grandfather and Lupus in her paternal aunt and paternal grandmother. Social History:  reports that she has never smoked. She has never used smokeless tobacco. She reports that she does not drink alcohol or use illicit drugs.   Prenatal Transfer Tool  Maternal Diabetes: No Genetic Screening: Normal Maternal Ultrasounds/Referrals: Normal Fetal Ultrasounds or other Referrals:  None Maternal Substance Abuse:  No Significant Maternal Medications:  None Significant Maternal Lab Results:  Lab values include: Group B Strep negative Other Comments:  Dated by a 6 wk ultrasound  Review of Systems  Gastrointestinal: Positive for abdominal pain (contractions).  All other systems reviewed and are negative.    Dilation: 5 Effacement (%): 80 Station: -2 Exam by:: S. Carrera, RNC Blood pressure 135/69, pulse 108, temperature 98.9 F (37.2 C), temperature source Oral, resp. rate 22. Maternal Exam:  Uterine Assessment: Contraction strength is firm.  Contraction frequency is regular.   Abdomen: Estimated fetal weight is 7-7.5 lbs.   Fetal presentation: vertex  Introitus: Vagina is positive  for vaginal discharge (mucusy).  Pelvis: adequate for delivery.      Fetal Exam Fetal Monitor Review: Baseline rate: 140's.  Variability: moderate (6-25 bpm).   Pattern: accelerations present.    Fetal State Assessment: Category I - tracings are normal.     Physical Exam  Constitutional: She is oriented to person, place, and time. She appears well-developed and well-nourished. She appears distressed.  HENT:  Head: Normocephalic.  Neck: Normal range of motion. Neck supple.  Cardiovascular: Normal rate, regular rhythm and normal heart sounds.   Respiratory: Effort normal and breath sounds normal. No respiratory distress.  GI: Soft. There is no tenderness.  Genitourinary: No bleeding around the vagina. Vaginal discharge (mucusy) found.  Musculoskeletal: Normal range of motion. She exhibits no edema.  Neurological: She is alert and oriented to person, place, and time.  Skin: Skin is warm and dry.    Prenatal labs: ABO, Rh: A/POS/-- (01/22 1107) Antibody: NEG (01/22 1107) Rubella: 1.24 (01/22 1107) RPR: NON REAC (04/09 1124)  HBsAg: NEGATIVE (01/22 1107)  HIV: NON REACTIVE (04/09 1124)  GBS:     Assessment/Plan: G2P1001 at 39 wks IUP Active Labor GBS Negative Thyromegaly - Normal Labs  Plan: Admit to Birthing Suites Anticipate NSVD   [facility] 08/30/2012, 6:00 PM

## 2012-08-30 NOTE — Progress Notes (Signed)
walidah muhammad cnm notified of patient, current sve result, tracing, ctx pattern and patient crying at the possibility of discharge.

## 2012-08-30 NOTE — H&P (Signed)
Chart reviewed and agree with management and plan.  

## 2012-08-30 NOTE — MAU Note (Addendum)
Patient is in for labor eval. She states that the ctx are so intense for she can't breath for 2 hours. Patient requests to be induced for labor due to the pain. She denies vaginal bleeding or lof. She reports good fetal movement

## 2012-08-30 NOTE — MAU Note (Signed)
Was seen in MAU earlier and D/C'd home. Returns with increased UC's, more intense.

## 2012-08-31 ENCOUNTER — Encounter (HOSPITAL_COMMUNITY): Payer: Self-pay | Admitting: Family Medicine

## 2012-08-31 MED ORDER — OXYCODONE-ACETAMINOPHEN 5-325 MG PO TABS
1.0000 | ORAL_TABLET | ORAL | Status: DC | PRN
  Administered 2012-08-31 (×2): 1 via ORAL
  Filled 2012-08-31 (×2): qty 1

## 2012-08-31 MED ORDER — FLEET ENEMA 7-19 GM/118ML RE ENEM: 1.0000 | ENEMA | Freq: Every day | RECTAL | Status: DC | PRN

## 2012-08-31 MED ORDER — FERROUS SULFATE 325 (65 FE) MG PO TABS
325.0000 mg | ORAL_TABLET | Freq: Two times a day (BID) | ORAL | Status: DC
  Administered 2012-08-31 – 2012-09-02 (×5): 325 mg via ORAL
  Filled 2012-08-31 (×5): qty 1

## 2012-08-31 MED ORDER — OXYTOCIN 40 UNITS IN LACTATED RINGERS INFUSION - SIMPLE MED: 62.5000 mL/h | INTRAVENOUS | Status: DC | PRN

## 2012-08-31 MED ORDER — IBUPROFEN 600 MG PO TABS
600.0000 mg | ORAL_TABLET | Freq: Four times a day (QID) | ORAL | Status: DC
  Administered 2012-08-31 – 2012-09-02 (×9): 600 mg via ORAL
  Filled 2012-08-31 (×9): qty 1

## 2012-08-31 MED ORDER — ONDANSETRON HCL 4 MG/2ML IJ SOLN: 4.0000 mg | INTRAMUSCULAR | Status: DC | PRN

## 2012-08-31 MED ORDER — ONDANSETRON HCL 4 MG PO TABS: 4.0000 mg | ORAL_TABLET | ORAL | Status: DC | PRN

## 2012-08-31 MED ORDER — DIBUCAINE 1 % RE OINT: 1.0000 "application " | TOPICAL_OINTMENT | RECTAL | Status: DC | PRN

## 2012-08-31 MED ORDER — SIMETHICONE 80 MG PO CHEW: 80.0000 mg | CHEWABLE_TABLET | ORAL | Status: DC | PRN

## 2012-08-31 MED ORDER — BENZOCAINE-MENTHOL 20-0.5 % EX AERO
1.0000 "application " | INHALATION_SPRAY | CUTANEOUS | Status: DC | PRN
  Administered 2012-08-31: 1 via TOPICAL
  Filled 2012-08-31: qty 56

## 2012-08-31 MED ORDER — TETANUS-DIPHTH-ACELL PERTUSSIS 5-2.5-18.5 LF-MCG/0.5 IM SUSP: 0.5000 mL | Freq: Once | INTRAMUSCULAR | Status: DC

## 2012-08-31 MED ORDER — WITCH HAZEL-GLYCERIN EX PADS: 1.0000 "application " | MEDICATED_PAD | CUTANEOUS | Status: DC | PRN

## 2012-08-31 MED ORDER — LANOLIN HYDROUS EX OINT: TOPICAL_OINTMENT | CUTANEOUS | Status: DC | PRN

## 2012-08-31 MED ORDER — MEASLES, MUMPS & RUBELLA VAC ~~LOC~~ INJ
0.5000 mL | INJECTION | Freq: Once | SUBCUTANEOUS | Status: DC
  Filled 2012-08-31: qty 0.5

## 2012-08-31 MED ORDER — BISACODYL 10 MG RE SUPP: 10.0000 mg | Freq: Every day | RECTAL | Status: DC | PRN

## 2012-08-31 MED ORDER — PRENATAL MULTIVITAMIN CH
1.0000 | ORAL_TABLET | Freq: Every day | ORAL | Status: DC
  Administered 2012-08-31 – 2012-09-02 (×3): 1 via ORAL
  Filled 2012-08-31 (×3): qty 1

## 2012-08-31 MED ORDER — SENNOSIDES-DOCUSATE SODIUM 8.6-50 MG PO TABS
2.0000 | ORAL_TABLET | Freq: Every day | ORAL | Status: DC
  Administered 2012-08-31 – 2012-09-01 (×2): 2 via ORAL

## 2012-08-31 MED ORDER — ZOLPIDEM TARTRATE 5 MG PO TABS: 5.0000 mg | ORAL_TABLET | Freq: Every evening | ORAL | Status: DC | PRN

## 2012-08-31 MED ORDER — DIPHENHYDRAMINE HCL 25 MG PO CAPS: 25.0000 mg | ORAL_CAPSULE | Freq: Four times a day (QID) | ORAL | Status: DC | PRN

## 2012-08-31 NOTE — Progress Notes (Signed)
Patient ID: SANIYYA GAU, female   DOB: Feb 12, 1991, 22 y.o.   MRN: [id number]  S:  Pt uncomfortable with ctx.  O:   Filed Vitals:   08/31/12 0134  BP: 138/88  Pulse: 85  Temp:   Resp: 20    Dilation: 7.5 Effacement (%): 80 Cervical Position: Middle Station: -2 Presentation: Vertex Exam by:: Dr. Thad Ranger  AROM:  Clear  FHTs: 130, mod var, accels present, occasional early and short/mild variable TOCO:  q 2-3 minutes  A/P: 22 y.o. G2P1001 at [redacted]w[redacted]d with SOL - Progressing well without intervention - AROM, clear - Anticipate SVD soon  Napoleon Form, MD

## 2012-08-31 NOTE — Progress Notes (Signed)
UR completed 

## 2012-09-01 NOTE — Plan of Care (Signed)
Problem: Phase II Progression Outcomes Goal: Incision intact & without signs/symptoms of infection Outcome: Not Applicable Date Met:  09/01/12 NO INCISION

## 2012-09-01 NOTE — Progress Notes (Signed)
Post Partum Day 1  Subjective: no complaints, up ad lib, voiding and tolerating PO  Objective: Blood pressure 115/63, pulse 78, temperature 98.4 F (36.9 C), temperature source Oral, resp. rate 18, height 5\' 3"  (1.6 m), weight 95.709 kg (211 lb), SpO2 100.00%, unknown if currently breastfeeding.  Physical Exam:  General: alert, cooperative and no distress Lochia: appropriate Uterine Fundus: firm DVT Evaluation: No evidence of DVT seen on physical exam. No cords or calf tenderness. No significant calf/ankle edema.   Recent Labs  08/30/12 1830  HGB 10.1*  HCT 30.4*    Assessment/Plan: Patient is doing well.   Breastfeeding, planning OCP's for contraception. Plan for discharge tomorrow.   LOS: 2 days   Everlene Other 09/01/2012, 7:17 AM

## 2012-09-01 NOTE — Progress Notes (Signed)
I saw and examined patient and agree with above resident note. I reviewed history, delivery summary, labs and vitals. Mikaele Stecher, MD  

## 2012-09-02 MED ORDER — NORETHINDRONE 0.35 MG PO TABS: 1.0000 | ORAL_TABLET | Freq: Every day | ORAL | Status: DC

## 2012-09-02 MED ORDER — IBUPROFEN 600 MG PO TABS: 600.0000 mg | ORAL_TABLET | Freq: Four times a day (QID) | ORAL | Status: DC

## 2012-09-02 NOTE — Progress Notes (Signed)
Mother and baby bracelet [phone] matched prior to leaving . Hugs tag removed in nursery

## 2012-09-02 NOTE — Discharge Summary (Signed)
Obstetric Discharge Summary Reason for Admission: onset of labor Prenatal Procedures: ultrasound Intrapartum Procedures: spontaneous vaginal delivery Postpartum Procedures: none Complications-Operative and Postpartum: 1st degree perineal laceration Hemoglobin  Date Value Range Status  08/30/2012 10.1* 12.0 - 15.0 g/dL Final     HCT  Date Value Range Status  08/30/2012 30.4* 36.0 - 46.0 % Final    Physical Exam:  General: alert, cooperative and no distress Lochia: appropriate Uterine Fundus: firm DVT Evaluation: No evidence of DVT seen on physical exam. No cords or calf tenderness. No significant calf/ankle edema.  Discharge Diagnoses: Term Pregnancy-delivered  Hospital Course: Mary [name] is a 22 y.o. G2P2002 at [redacted]w[redacted]d who was admitted to the hospital after presenting for contractions. She had an uncomplicated SVD.  Pt is breast feeding and plans to use OCP's for contraception. She will follow up with [facility] in 6 weeks.   Discharge Information: Date: 09/02/2012 Activity: pelvic rest Diet: routine Medications: PNV and Ibuprofen Condition: stable Instructions: refer to practice specific booklet Discharge to: home   Newborn Data: Live born female  Birth Weight: 8 lb 6 oz (3799 g) APGAR: 9, 9  Home with mother.  Everlene Other 09/02/2012, 7:14 AM  I saw and examined patient and agree with above resident note. I reviewed history, delivery summary, labs and vitals. Napoleon Form, MD

## 2012-09-05 NOTE — Discharge Summary (Signed)
Attestation of Attending Supervision of Advanced Practitioner: Evaluation and management procedures were performed by the PA/NP/CNM/OB Fellow under my supervision/collaboration. Chart reviewed and agree with management and plan.  Damiano Stamper V 09/05/2012 6:06 PM

## 2012-11-03 ENCOUNTER — Ambulatory Visit: Payer: Medicaid Other | Admitting: Advanced Practice Midwife

## 2012-12-01 ENCOUNTER — Ambulatory Visit: Payer: Medicaid Other | Admitting: Advanced Practice Midwife

## 2013-09-14 IMAGING — US US OB TRANSVAGINAL
1 series · 13 of 28 positions shown · non-contrast
Comparison: none

[Series 1: us ob transvaginal · 34 acquisitions, 13 frames shown]
[im 2/34]
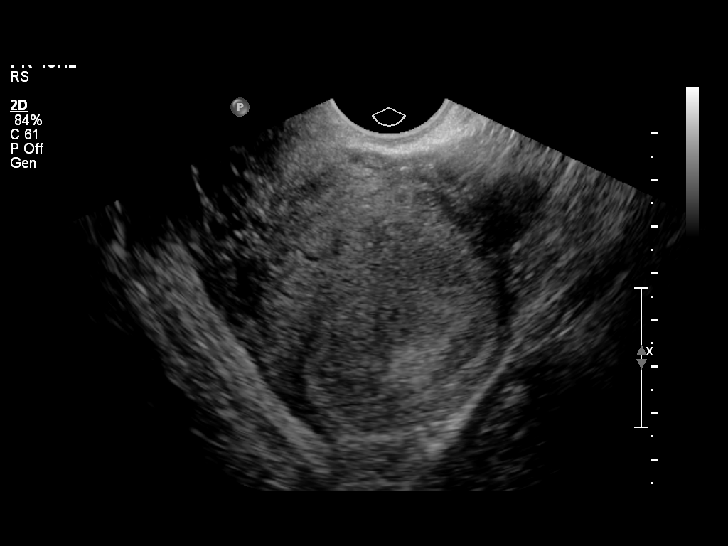
[im 4/34]
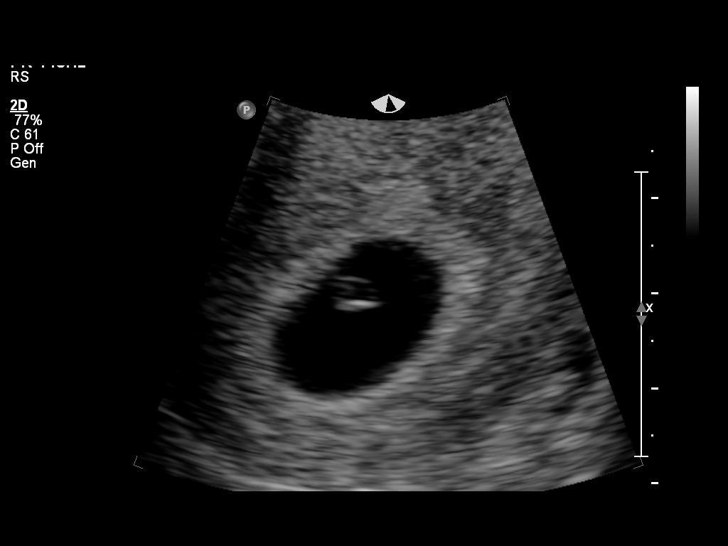
[im 7/34]
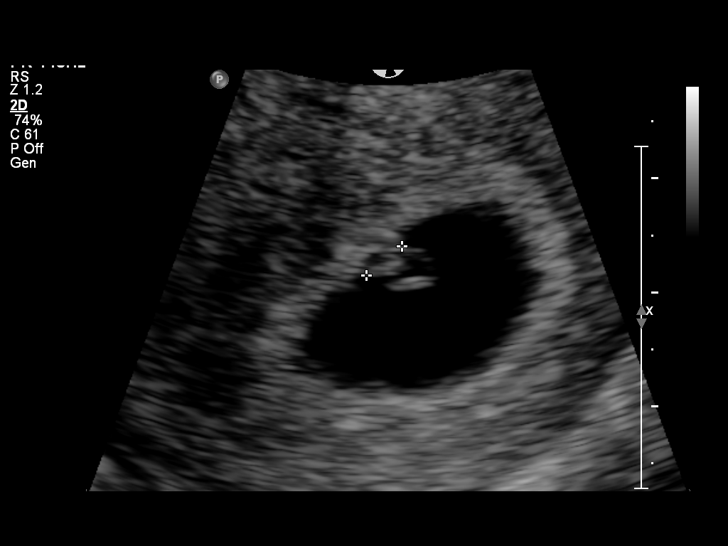
[im 9/34]
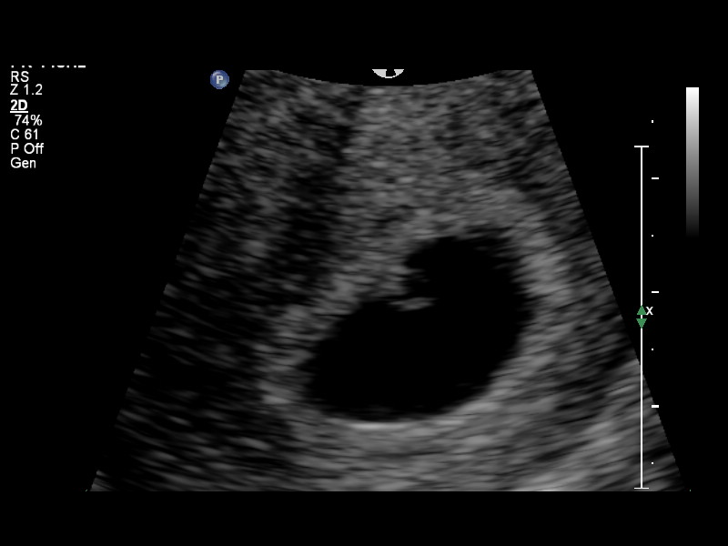
[im 12/34]
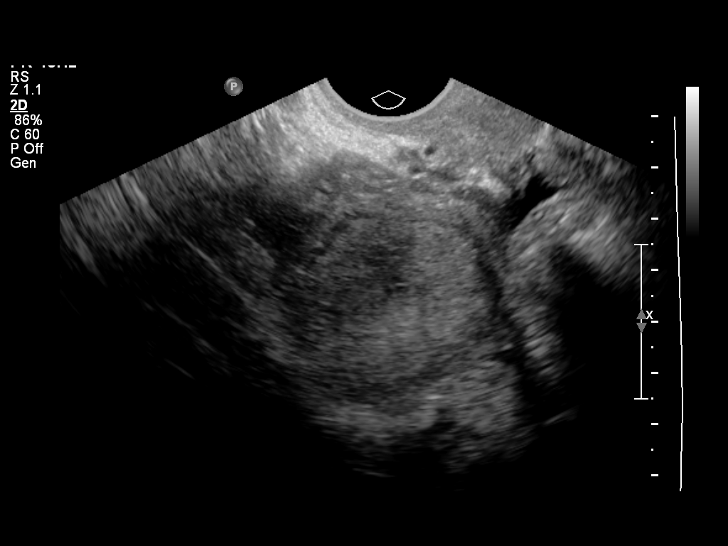
[im 14/34]
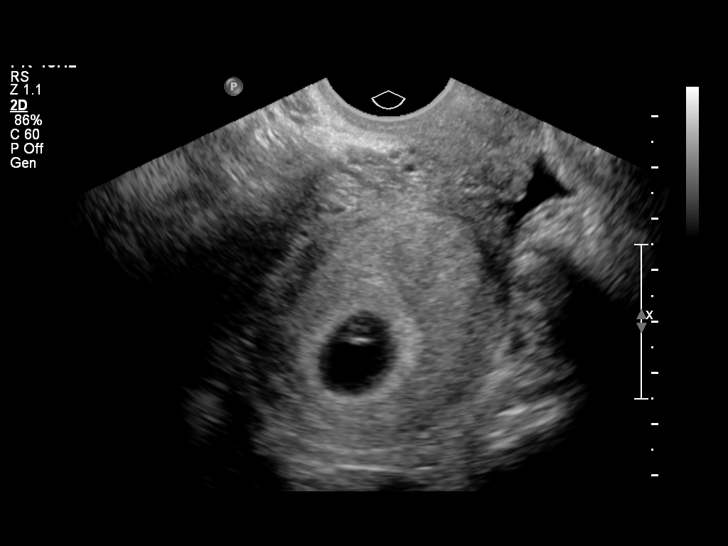
[im 18/34]
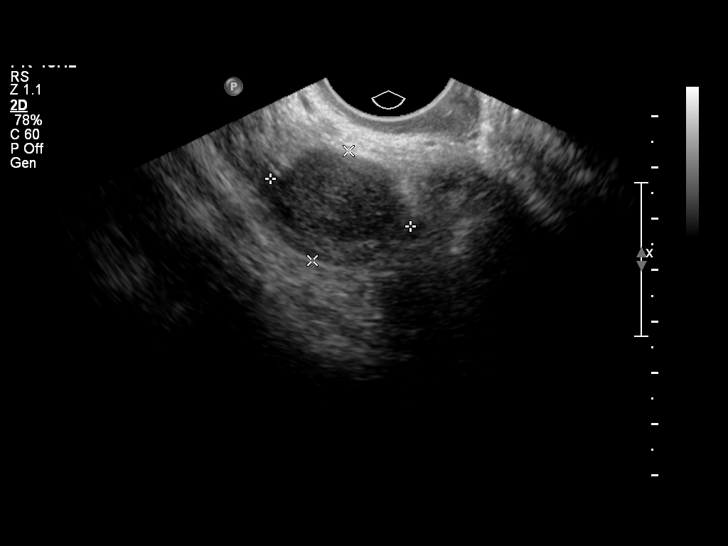
[im 20/34]
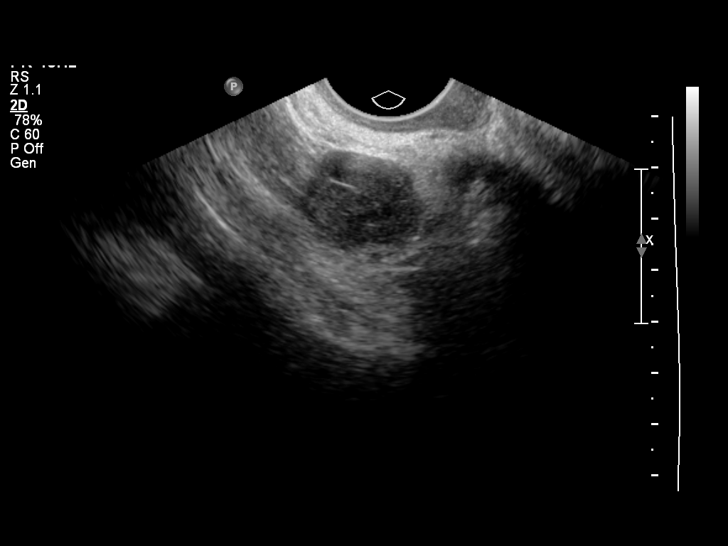
[im 23/34]
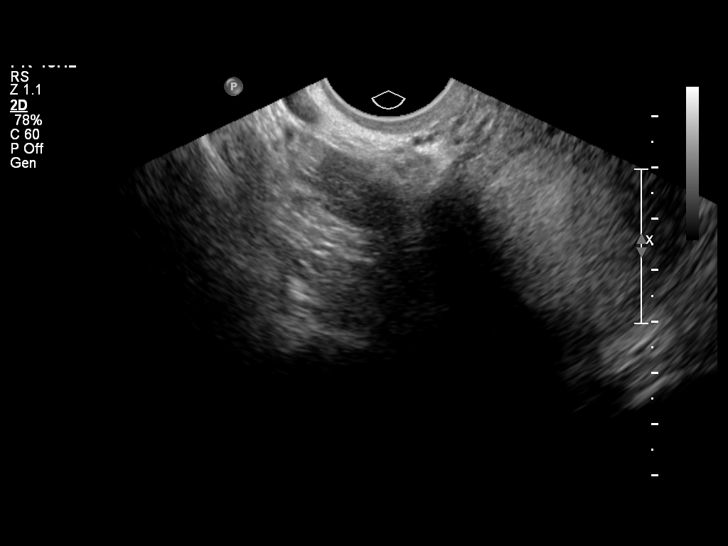
[im 25/34]
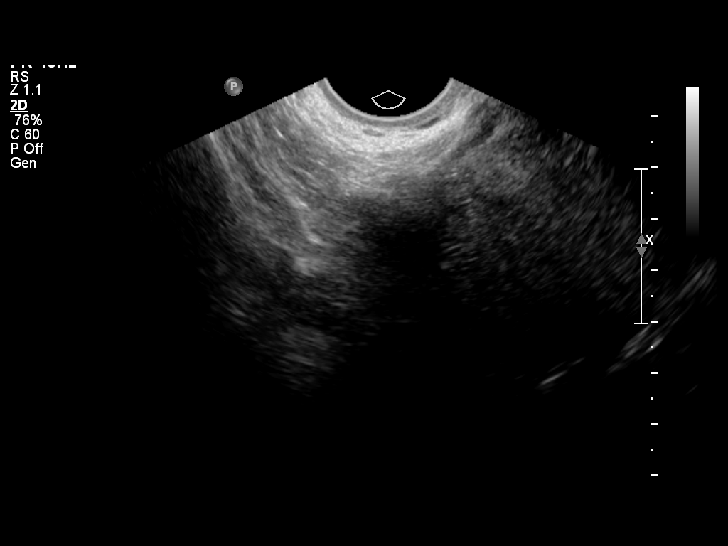
[im 27/34]
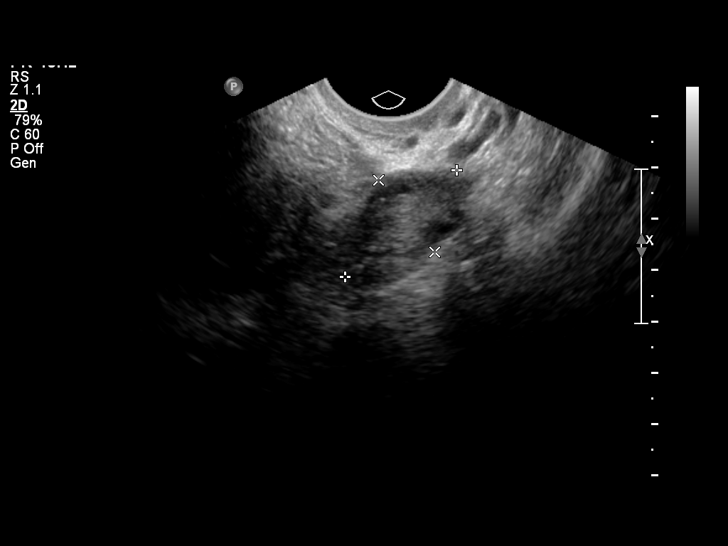
[im 30/34]
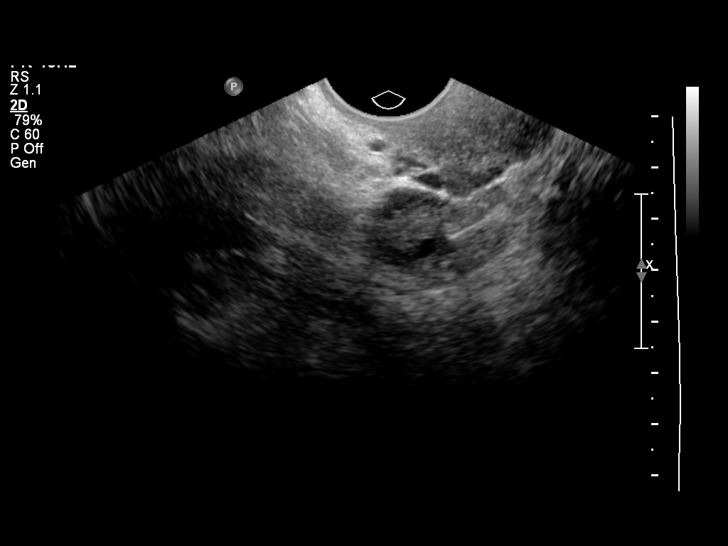
[im 32/34]
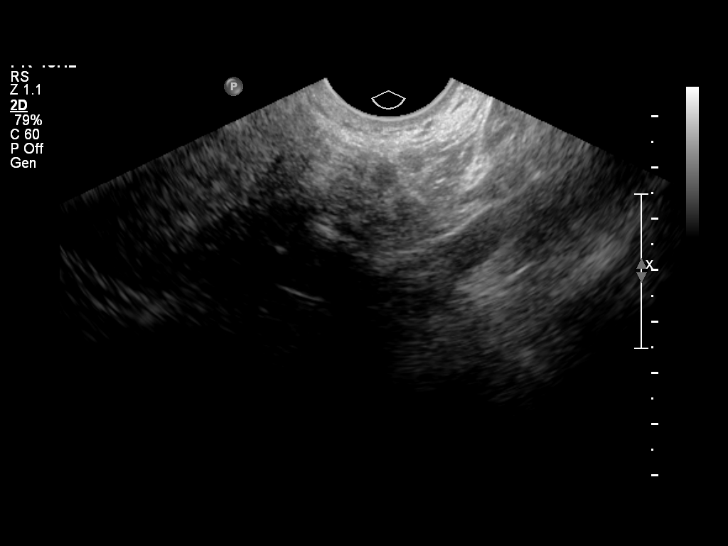

[13 of 28 positions shown; findings below may reference images not displayed]

OBSTETRICS REPORT
                      (Signed Final 01/07/2012 [DATE])

Service(s) Provided

 US OB TRANSVAGINAL                                    76817.0
Indications

 Pain - Abdominal/Pelvic
 Unsure of LMP;  Establish Gestational [AGE]
 Pregnancy with inconclusive fetal viability
 Plan B exposure in pregnancy (End [DATE])
 Poor obstetric history: Previous /gestational HTN
 Nausea/Vomiting
Fetal Evaluation

 Num Of Fetuses:    1
 Preg. Location:    Intrauterine
 Gest. Sac:         Intrauterine
 Yolk Sac:          Visualized
 Fetal Pole:        Visualized
 Fetal Heart Rate:  124                         bpm
 Cardiac Activity:  Observed
Biometry

 CRL:      3.7  mm    G. Age:   6w 0d                  EDD:   09/01/12
Gestational Age

 Best:          6w 0d     Det. By:   U/S C R L (01/07/12)     EDD:   09/01/12
Cervix Uterus Adnexa

 Cervix:       Normal appearance by transvaginal scan
 Uterus:       Retroverted. Normal shape and size.
 Cul De Sac:   Trace amount of free fluid seen.

 Left Ovary:   Size(cm) L: 3.01 x W: 1.69 x H: 1.78  Volume(cc):
 Right Ovary:  Size(cm) L: 2.88 x W: 2.11 x H: 2.25  Volume(cc):
 Adnexa:     No abnormality visualized.
Impression

 Single living IUP with US Gest. Age of 6w 0d, and EDD of
 09/01/2012.
 No significant maternal uterine or adnexal abnormality
 identified.

## 2014-01-09 ENCOUNTER — Encounter (HOSPITAL_COMMUNITY): Payer: Self-pay | Admitting: Family Medicine

## 2015-10-03 ENCOUNTER — Ambulatory Visit (INDEPENDENT_AMBULATORY_CARE_PROVIDER_SITE_OTHER): Payer: BLUE CROSS/BLUE SHIELD | Admitting: Physician Assistant

## 2015-10-03 VITALS — BP 122/82 | HR 97 | Temp 98.6°F | Resp 16 | Ht 63.0 in | Wt 201.0 lb

## 2015-10-03 DIAGNOSIS — Z130 Encounter for screening for diseases of the blood and blood-forming organs and certain disorders involving the immune mechanism: Secondary | ICD-10-CM | POA: Diagnosis not present

## 2015-10-03 DIAGNOSIS — Z840 Family history of diseases of the skin and subcutaneous tissue: Secondary | ICD-10-CM | POA: Diagnosis not present

## 2015-10-03 DIAGNOSIS — Z1329 Encounter for screening for other suspected endocrine disorder: Secondary | ICD-10-CM

## 2015-10-03 DIAGNOSIS — Z113 Encounter for screening for infections with a predominantly sexual mode of transmission: Secondary | ICD-10-CM

## 2015-10-03 DIAGNOSIS — Z124 Encounter for screening for malignant neoplasm of cervix: Secondary | ICD-10-CM | POA: Diagnosis not present

## 2015-10-03 DIAGNOSIS — Z13228 Encounter for screening for other metabolic disorders: Secondary | ICD-10-CM | POA: Diagnosis not present

## 2015-10-03 DIAGNOSIS — M2550 Pain in unspecified joint: Secondary | ICD-10-CM

## 2015-10-03 DIAGNOSIS — Z8269 Family history of other diseases of the musculoskeletal system and connective tissue: Secondary | ICD-10-CM

## 2015-10-03 DIAGNOSIS — Z0000 Encounter for general adult medical examination without abnormal findings: Secondary | ICD-10-CM | POA: Diagnosis not present

## 2015-10-03 LAB — CBC
HEMATOCRIT: 37.7 % (ref 35.0–45.0)
HEMOGLOBIN: 12.2 g/dL (ref 11.7–15.5)
MCH: 29.5 pg (ref 27.0–33.0)
MCHC: 32.4 g/dL (ref 32.0–36.0)
MCV: 91.3 fL (ref 80.0–100.0)
MPV: 10 fL (ref 7.5–12.5)
Platelets: 342 10*3/uL (ref 140–400)
RBC: 4.13 MIL/uL (ref 3.80–5.10)
RDW: 14.2 % (ref 11.0–15.0)
WBC: 7.4 10*3/uL (ref 3.8–10.8)

## 2015-10-03 LAB — THYROID PANEL WITH TSH
Free Thyroxine Index: 3.3 (ref 1.4–3.8)
T3 Uptake: 30 % (ref 22–35)
T4, Total: 11.1 ug/dL (ref 4.5–12.0)
TSH: 1.45 m[IU]/L

## 2015-10-03 LAB — COMPLETE METABOLIC PANEL WITH GFR
ALBUMIN: 4.7 g/dL (ref 3.6–5.1)
ALT: 11 U/L (ref 6–29)
AST: 15 U/L (ref 10–30)
Alkaline Phosphatase: 64 U/L (ref 33–115)
BUN: 16 mg/dL (ref 7–25)
CHLORIDE: 102 mmol/L (ref 98–110)
CO2: 23 mmol/L (ref 20–31)
CREATININE: 0.95 mg/dL (ref 0.50–1.10)
Calcium: 9.4 mg/dL (ref 8.6–10.2)
GFR, Est African American: >89 mL/min (ref >=60)
GFR, Est Non African American: 83 mL/min (ref >=60)
GLUCOSE: 80 mg/dL (ref 65–99)
POTASSIUM: 4.2 mmol/L (ref 3.5–5.3)
SODIUM: 138 mmol/L (ref 135–146)
Total Bilirubin: 0.7 mg/dL (ref 0.2–1.2)
Total Protein: 7.2 g/dL (ref 6.1–8.1)

## 2015-10-03 NOTE — Progress Notes (Signed)
[facility] [address], [place] [address] [phone]  Date:  10/03/2015   Name:  Mary [name]   DOB:  [birth year]   MRN:  [id number]  PCP:  Glennis Brink, DO (Inactive)    History of Present Illness:  Mary [name] is a 25 y.o. female patient who presents to [facility] for cpe.   Concerns: thyroid , when she works out, her knees feel weak.  Lower back pain for a couple months.  No joint swelling.  She is concerned of possible lupus, as this runs in her family.  Maunt: breast, Maunt: lupus.  Grandmother: possible ovarian cancer.  Benign thyroid.    [place]:  Used soaps, and had itching.  Fine ever since.    Diet: transitioned to vegan 3 weeks ago.  Eating health with grilled chicken and salad.  She lost 20lbs after exercise 3-4 months.  Exercises every day.    BM: normal.  No constipation issues, or diarrhea.  No blood stool or black stool.  Urination: no dysuria, hematuria, frequency.  Sleep: 5 hours, due to watching tv.  No difficulty sleeping or staying asleep.  Social activity: sing, exercise, mother, chorus Runner, broadcasting/film/video.   EtOH: none Illicit drug use: none Tobacco use or vaping: none  Sexually active: yes, one partner.  Unprotected.  No birth control.   No dyspareunia. 2 children, 3,4--boy and girl.  Vaginal deliveries.   Menses: normal, regular.  4-5 days.  Back pain associated with start menses.  Bleeding heavy, 6 per day.    Patient Active Problem List   Diagnosis Date Noted  . Umbilical hernia 05/26/2012  . Infections of genitourinary tract antepartum 05/26/2012  . Supervision of normal subsequent pregnancy 03/31/2012  . 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 Diabetes mellitus without complication (HCC)   . Pregnancy induced hypertension 06-04-11   gestational hypertention  . Thyroid enlarged     Past Surgical History:  Procedure Laterality Date  . NO PAST SURGERIES      Social History  Substance  Use Topics  . Smoking status: Never Smoker  . Smokeless tobacco: Never Used  . Alcohol use No    Family History  Problem Relation Age of Onset  . Diabetes Father   . Diabetes Paternal Grandfather   . Lupus Paternal Aunt   . Lupus Paternal Grandmother   . Hypertension Mother   . Diabetes Maternal Grandfather   . Hypertension Maternal Grandfather     Allergies  Allergen Reactions  . Procardia [Nifedipine] Nausea And Vomiting    Can take with zofran per patient    Medication list has been reviewed and updated.  Current Outpatient Prescriptions on File Prior to Visit  Medication Sig Dispense Refill  . ibuprofen (ADVIL,MOTRIN) 600 MG tablet Take 1 tablet (600 mg total) by mouth every 6 (six) hours. (Patient not taking: Reported on 10/03/2015) 30 tablet 0  . norethindrone (MICRONOR,CAMILA,ERRIN) 0.35 MG tablet Take 1 tablet (0.35 mg total) by mouth daily. (Patient not taking: Reported on 10/03/2015) 1 Package 2   No current facility-administered medications on file prior to visit.     Review of Systems  Constitutional: Negative for chills and fever.  HENT: Negative for ear discharge, ear pain and sore throat.   Eyes: Negative for blurred vision and double vision.  Respiratory: Negative for cough, shortness of breath and wheezing.   Cardiovascular: Negative for chest pain, palpitations and leg swelling.  Gastrointestinal: Negative  for diarrhea, nausea and vomiting.  Genitourinary: Negative for dysuria, frequency and hematuria.  Skin: Negative for itching and rash.  Neurological: Negative for dizziness and headaches.     Physical Examination: BP 122/82 (BP Location: Right Arm, Patient Position: Sitting, Cuff Size: Normal)   Pulse 97   Temp 98.6 F (37 C) (Oral)   Resp 16   Ht 5\' 3"  (1.6 m)   Wt 201 lb (91.2 kg)   LMP 09/25/2015   SpO2 100%   BMI 35.61 kg/m  Ideal Body Weight: Weight in (lb) to have BMI = 25: 140.8  Physical Exam  Constitutional: She is oriented to  person, place, and time. She appears well-developed and well-nourished. No distress.  HENT:  Head: Normocephalic and atraumatic.  Right Ear: Tympanic membrane, external ear and ear canal normal.  Left Ear: Tympanic membrane, external ear and ear canal normal.  Nose: Right sinus exhibits no maxillary sinus tenderness and no frontal sinus tenderness. Left sinus exhibits no maxillary sinus tenderness and no frontal sinus tenderness.  Mouth/Throat: Oropharynx is clear and moist. No uvula swelling. No oropharyngeal exudate, posterior oropharyngeal edema or posterior oropharyngeal erythema.  Eyes: Conjunctivae and EOM are normal. Pupils are equal, round, and reactive to light.  Neck: Normal range of motion. Neck supple. No thyromegaly present.  Cardiovascular: Normal rate, regular rhythm, normal heart sounds and intact distal pulses.  Exam reveals no gallop, no distant heart sounds and no friction rub.   No murmur heard. Pulmonary/Chest: Effort normal and breath sounds normal. No respiratory distress. She has no decreased breath sounds. She has no wheezes. She has no rhonchi.  Abdominal: Soft. Bowel sounds are normal. She exhibits no distension and no mass. There is no tenderness.  Genitourinary: Vagina normal. Pelvic exam was performed with patient supine. There is no rash on the right labia. There is no rash on the left labia. Cervix exhibits no motion tenderness and no discharge. Right adnexum displays no mass. Left adnexum displays no mass.  Musculoskeletal: Normal range of motion. She exhibits no edema or tenderness.  Lymphadenopathy:       Head (right side): No submandibular, no tonsillar, no preauricular and no posterior auricular adenopathy present.       Head (left side): No submandibular, no tonsillar, no preauricular and no posterior auricular adenopathy present.    She has no cervical adenopathy.  Neurological: She is alert and oriented to person, place, and time. No cranial nerve deficit.  She exhibits normal muscle tone. Coordination normal.  Skin: Skin is warm and dry. She is not diaphoretic.  Psychiatric: She has a normal mood and affect. Her behavior is normal.     Assessment and Plan: Mary [name] is a 25 y.o. female who is here today for an annual physical exam.   --will perform ANA and sed rate to assess possible rheumatoid disease. Annual physical exam - Plan: CBC, COMPLETE METABOLIC PANEL WITH GFR, Thyroid Panel With TSH, Sedimentation rate  Screening for deficiency anemia - Plan: CBC  Screening for thyroid disorder - Plan: Thyroid Panel With TSH  Screening for metabolic disorder - Plan: COMPLETE METABOLIC PANEL WITH GFR  Joint pain - Plan: Sedimentation rate  Family history of systemic lupus erythematosus - Plan: ANA  Screening for STD (sexually transmitted disease) - Plan: HIV antibody, RPR  Screening for cervical cancer - Plan: Pap IG and Chlamydia/Gonococcus, NAA  Trena Platt, PA-C [facility] 10/03/2015 3:51 PM

## 2015-10-03 NOTE — Patient Instructions (Signed)
I will have your lab results within 7-10 days.    Keeping You Healthy  Get These Tests 1. Blood Pressure- Have your blood pressure checked once a year by your health care provider.  Normal blood pressure is 120/80. 2. Weight- Have your body mass index (BMI) calculated to screen for obesity.  BMI is measure of body fat based on height and weight.  You can also calculate your own BMI at https://www.west-esparza.com/. 3. Cholesterol- Have your cholesterol checked every 5 years starting at age 4 then yearly starting at age 31. 4. Chlamydia, HIV, and other sexually transmitted diseases- Get screened every year until age 67, then within three months of each new sexual provider. 5. Pap Test - Every 1-5 years; discuss with your health care provider. 6. Mammogram- Every 1-2 years starting at age 4--50  Take these medicines  Calcium with Vitamin D-Your body needs 1200 mg of Calcium each day and [phone] IU of Vitamin D daily.  Your body can only absorb 500 mg of Calcium at a time so Calcium must be taken in 2 or 3 divided doses throughout the day.  Multivitamin with folic acid- Once daily if it is possible for you to become pregnant.  Get these Immunizations  Gardasil-Series of three doses; prevents HPV related illness such as genital warts and cervical cancer.  Menactra-Single dose; prevents meningitis.  Tetanus shot- Every 10 years.  Flu shot-Every year.  Take these steps 1. Do not smoke-Your healthcare provider can help you quit.  For tips on how to quit go to www.smokefree.gov or call 1-800 QUITNOW. 2. Be physically active- Exercise 5 days a week for at least 30 minutes.  If you are not already physically active, start slow and gradually work up to 30 minutes of moderate physical activity.  Examples of moderate activity include walking briskly, dancing, swimming, bicycling, etc. 3. Breast Cancer- A self breast exam every month is important for early detection of breast cancer.  For more  information and instruction on self breast exams, ask your healthcare provider or [place].es. 4. Eat a healthy diet- Eat a variety of healthy foods such as fruits, vegetables, whole grains, low fat milk, low fat cheeses, yogurt, lean meats, poultry and fish, beans, nuts, tofu, etc.  For more information go to www. Thenutritionsource.org 5. Drink alcohol in moderation- Limit alcohol intake to one drink or less per day. Never drink and drive. 6. Depression- Your emotional health is as important as your physical health.  If you're feeling down or losing interest in things you normally enjoy please talk to your healthcare provider about being screened for depression. 7. Dental visit- Brush and floss your teeth twice daily; visit your dentist twice a year. 8. Eye doctor- Get an eye exam at least every 2 years. 9. Helmet use- Always wear a helmet when riding a bicycle, motorcycle, rollerblading or skateboarding. 10. Safe sex- If you may be exposed to sexually transmitted infections, use a condom. 11. Seat belts- Seat belts can save your live; always wear one. 12. Smoke/Carbon Monoxide detectors- These detectors need to be installed on the appropriate level of your home. Replace batteries at least once a year. 13. Skin cancer- When out in the sun please cover up and use sunscreen 15 SPF or higher. 14. Violence- If anyone is threatening or hurting you, please tell your healthcare provider.

## 2015-10-04 LAB — HIV ANTIBODY (ROUTINE TESTING W REFLEX): HIV: NONREACTIVE

## 2015-10-04 LAB — RPR

## 2015-10-04 LAB — SEDIMENTATION RATE: SED RATE: 11 mm/h (ref 0–20)

## 2015-10-04 LAB — ANA: Anti Nuclear Antibody(ANA): NEGATIVE

## 2015-10-05 LAB — PAP IG AND CT-NG NAA
CHLAMYDIA PROBE AMP: NOT DETECTED
GC PROBE AMP: NOT DETECTED

## 2016-12-07 ENCOUNTER — Ambulatory Visit (INDEPENDENT_AMBULATORY_CARE_PROVIDER_SITE_OTHER): Payer: Self-pay | Admitting: Family Medicine

## 2016-12-07 VITALS — BP 116/70 | HR 73 | Temp 98.4°F | Resp 16 | Wt 197.8 lb

## 2016-12-07 DIAGNOSIS — Z021 Encounter for pre-employment examination: Secondary | ICD-10-CM

## 2016-12-07 NOTE — Patient Instructions (Signed)
Schedule a follow-up appointment to have your thyroid gland further evaluated. Enlargement was noted on exam today.    Goiter A goiter is an enlarged thyroid gland. The thyroid gland is located in the lower front of the neck. The gland produces hormones that regulate mood, body temperature, pulse rate, and digestion. Most goiters are painless and are not a cause for serious concern. Goiters and conditions that cause goiters can be treated, if necessary. What are the causes? Causes of this condition include:  Diseases that attack healthy cells in your body (autoimmune diseases) and affect your thyroid function, such as: ? Graves disease. This causes too much thyroid hormone to be produced and it makes your thyroid overly active (hyperthyroidism). ? Hashimoto disease. This type of inflammation of the thyroid (thyroiditis) causes too little thyroid hormone to be produced and it makes your thyroid not active enough (hypothyroidism).  Other conditions that cause thyroiditis.  Nodular goiter. This means that there are one or more small growths on your thyroid. These can create too much thyroid hormone.  Pregnancy.  Thyroid cancer. This is rare.  Certain medicines.  Radiation exposure.  Iodine deficiency.  In some cases, the cause may not be known (idiopathic). What increases the risk? This condition is more likely to develop in:  People who have a family history of goiter.  Women.  People who do not get enough iodine in their diet.  People who are older than 40.  People who smoke tobacco.  What are the signs or symptoms? Common symptoms of this condition include:  Swelling in the lower part of the neck. This swelling can range from a very small bump to a large lump.  A tight feeling in the throat.  A hoarse voice.  Other symptoms include:  Coughing.  Wheezing.  Difficulty swallowing.  Difficulty breathing.  Bulging neck veins.  Dizziness.  In some cases,  there are no symptoms and thyroid hormone levels may be normal. When a goiter is the result of hyperthyroidism, symptoms may also include:  Nervousness or restlessness.  Inability to tolerate heat.  Unexplained weight loss.  Diarrhea.  Change in the texture of hair or skin.  Changes in heart beat, such as skipped beats, extra beats, or a rapid heart rate.  Loss of menstruation.  Shaky hands.  Increased appetite.  Sleep problems.  When a goiter is the result of hypothyroidism, symptoms may also include:  Feeling like you have no energy (lethargy).  Inability to tolerate cold.  Weight gain that is not explained by a change in diet or exercise habits.  Dry skin.  Coarse hair.  Menstrual irregularity.  Constipation.  Sadness or depression.  How is this diagnosed? This condition may be diagnosed with a medical history and physical exam. You may also have other tests, including:  Blood tests to check thyroid function.  Imaging tests, such as: ? Ultrasonography. ? CT scan. ? MRI. ? Thyroid scan. You will be given a safe radioactive injection, then images will be taken of your thyroid.  Tissue sample (biopsy) of the goiter or any nodules. This checks to see if the goiter or nodules are cancerous.  How is this treated? Treatment for this condition depends on the cause. Treatment may include:  Medicines to control your thyroid.  Anti-inflammatory or steroid medicines, if inflammation is the cause.  Iodine supplements or changes in diet, if the goiter is caused by iodine deficiency.  Radiation therapy.  Surgery to remove your thyroid.  In some cases,  no treatment is necessary, and your health care provider will monitor your condition at regular checkups. Follow these instructions at home:  Follow recommendations from your health care provider for any changes to your diet.  Take over-the-counter and prescription medicines only as told by your health care  provider.  Do not use any tobacco products, including cigarettes, chewing tobacco, or e-cigarettes. If you need help quitting, ask your health care provider.  Keep all follow-up appointments as told by your health care provider. This is important. Contact a health care provider if:  Your symptoms do not get better with treatment. Get help right away if:  You develop sudden, unexplained confusion or other mental changes.  You have nausea, vomiting, or diarrhea.  You develop a fever.  Your skin or the whites of your eyes appear yellow (jaundice).  You develop chest pain.  You have trouble breathing or swallowing.  You suddenly become very weak.  You experience extreme restlessness. This information is not intended to replace advice given to you by your health care provider. Make sure you discuss any questions you have with your health care provider. Document Released: 08/14/2009 Document Revised: 09/14/2015 Document Reviewed: 02/20/2014 Elsevier Interactive Patient Education  2018 [employer].     Exercising to [employer] Exercising regularly is important. It has many health benefits, such as:  Improving your overall fitness, flexibility, and endurance.  Increasing your bone density.  Helping with weight control.  Decreasing your body fat.  Increasing your muscle strength.  Reducing stress and tension.  Improving your overall health.  In order to become healthy and stay healthy, it is recommended that you do moderate-intensity and vigorous-intensity exercise. You can tell that you are exercising at a moderate intensity if you have a higher heart rate and faster breathing, but you are still able to hold a conversation. You can tell that you are exercising at a vigorous intensity if you are breathing much harder and faster and cannot hold a conversation while exercising. How often should I exercise? Choose an activity that you enjoy and set realistic goals. Your  health care provider can help you to make an activity plan that works for you. Exercise regularly as directed by your health care provider. This may include:  Doing resistance training twice each week, such as: ? Push-ups. ? Sit-ups. ? Lifting weights. ? Using resistance bands.  Doing a given intensity of exercise for a given amount of time. Choose from these options: ? 150 minutes of moderate-intensity exercise every week. ? 75 minutes of vigorous-intensity exercise every week. ? A mix of moderate-intensity and vigorous-intensity exercise every week.  Children, pregnant women, people who are out of shape, people who are overweight, and older adults may need to consult a health care provider for individual recommendations. If you have any sort of medical condition, be sure to consult your health care provider before starting a new exercise program. What are some exercise ideas? Some moderate-intensity exercise ideas include:  Walking at a rate of 1 mile in 15 minutes.  Biking.  Hiking.  Golfing.  Dancing.  Some vigorous-intensity exercise ideas include:  Walking at a rate of at least 4.5 miles per hour.  Jogging or running at a rate of 5 miles per hour.  Biking at a rate of at least 10 miles per hour.  Lap swimming.  Roller-skating or in-line skating.  Cross-country skiing.  Vigorous competitive sports, such as football, basketball, and soccer.  Jumping rope.  Aerobic dancing.  What  are some everyday activities that can help me to get exercise?  Yard work, such as: ? Pushing a Surveyor, mining. ? Raking and bagging leaves.  Washing and waxing your car.  Pushing a stroller.  Shoveling snow.  Gardening.  Washing windows or floors. How can I be more active in my day-to-day activities?  Use the stairs instead of the elevator.  Take a walk during your lunch break.  If you drive, park your car farther away from work or school.  If you take public  transportation, get off one stop early and walk the rest of the way.  Make all of your phone calls while standing up and walking around.  Get up, stretch, and walk around every 30 minutes throughout the day. What guidelines should I follow while exercising?  Do not exercise so much that you hurt yourself, feel dizzy, or get very short of breath.  Consult your health care provider before starting a new exercise program.  Wear comfortable clothes and shoes with good support.  Drink plenty of water while you exercise to prevent dehydration or heat stroke. Body water is lost during exercise and must be replaced.  Work out until you breathe faster and your heart beats faster. This information is not intended to replace advice given to you by your health care provider. Make sure you discuss any questions you have with your health care provider. Document Released: 03/29/2010 Document Revised: 08/02/2015 Document Reviewed: 07/28/2013 Elsevier Interactive Patient Education  [employer].

## 2016-12-07 NOTE — Progress Notes (Signed)
Subjective:  Mary [name] is a 26 y.o. female who presents for basic physical exam. Patient denies any current health related concerns. Yomara works for the [employer] system and requires a complete physical exam for employment.    Immunization History  Administered Date(s) Administered  . Tdap 05/21/2011    Past Medical History:  Diagnosis Date  . Diabetes mellitus without complication (HCC)   . Pregnancy induced hypertension 06-04-11   gestational hypertention  . Thyroid enlarged     Past Surgical History:  Procedure Laterality Date  . NO PAST SURGERIES      Social History  Substance Use Topics  . Smoking status: Never Smoker  . Smokeless tobacco: Never Used  . Alcohol use No    Allergies  Allergen Reactions  . Procardia [Nifedipine] Nausea And Vomiting    Can take with zofran per patient    Current Outpatient Prescriptions  Medication Sig Dispense Refill  . ibuprofen (ADVIL,MOTRIN) 600 MG tablet Take 1 tablet (600 mg total) by mouth every 6 (six) hours. (Patient not taking: Reported on 10/03/2015) 30 tablet 0  . norethindrone (MICRONOR,CAMILA,ERRIN) 0.35 MG tablet Take 1 tablet (0.35 mg total) by mouth daily. (Patient not taking: Reported on 10/03/2015) 1 Package 2   No current facility-administered medications for this visit.    Review of Systems  All other systems reviewed and are negative.    Objective:   Vitals:   12/07/16 1548  BP: 116/70  Pulse: 73  Resp: 16  Temp: 98.4 F (36.9 C)  SpO2: 100%  Physical Exam  Constitutional: She is oriented to person, place, and time. She appears well-developed.  HENT:  Head: Normocephalic and atraumatic.  Eyes: Pupils are equal, round, and reactive to light. Conjunctivae and EOM are normal.  Neck: Normal range of motion. Neck supple. Thyromegaly present.  Bilateral enlargement if thyroid gland noted on exam  Cardiovascular: Normal rate, regular rhythm, normal heart sounds and intact distal  pulses.   Pulmonary/Chest: Effort normal and breath sounds normal.  Abdominal: Soft. Bowel sounds are normal. There is no tenderness.  Musculoskeletal: Normal range of motion.  Lymphadenopathy:    She has no cervical adenopathy.  Neurological: She is alert and oriented to person, place, and time. She displays normal reflexes. No sensory deficit. She exhibits normal muscle tone. Coordination normal.  Skin: Skin is warm and dry. Capillary refill takes less than 2 seconds.  Psychiatric: She has a normal mood and affect. Judgment and thought content normal.   Assessment:  basic physical exam    Plan:  Patient education provided.  Patient advised to follow-up with PCP for further evaluation of thyroid enlargement. Return with health form and to have TB skin test read within 48 hours.  Orders Placed This Encounter  Procedures  . PPD    Godfrey Pick. Tiburcio Pea, MSN, FNP-C [address]  [place], [place] [address] [phone]

## 2016-12-09 LAB — TB SKIN TEST
Induration: NEGATIVE mm
TB SKIN TEST: NEGATIVE

## 2017-09-18 LAB — GLUCOSE, POCT (MANUAL RESULT ENTRY): POC Glucose: 96 mg/dl (ref 70–99)

## 2018-03-22 ENCOUNTER — Ambulatory Visit: Payer: Self-pay | Admitting: Physician Assistant

## 2018-04-06 ENCOUNTER — Ambulatory Visit: Payer: Self-pay | Admitting: Physician Assistant

## 2018-04-26 ENCOUNTER — Ambulatory Visit: Payer: Self-pay | Admitting: Physician Assistant

## 2018-05-04 ENCOUNTER — Ambulatory Visit: Payer: Self-pay | Admitting: Physician Assistant

## 2018-05-07 ENCOUNTER — Ambulatory Visit: Payer: BC Managed Care – PPO | Admitting: Physician Assistant

## 2018-05-07 ENCOUNTER — Encounter: Payer: Self-pay | Admitting: Physician Assistant

## 2018-05-07 VITALS — BP 112/80 | HR 57 | Temp 98.7°F | Ht 63.0 in | Wt 200.0 lb

## 2018-05-07 DIAGNOSIS — Z136 Encounter for screening for cardiovascular disorders: Secondary | ICD-10-CM

## 2018-05-07 DIAGNOSIS — E669 Obesity, unspecified: Secondary | ICD-10-CM

## 2018-05-07 DIAGNOSIS — Z0001 Encounter for general adult medical examination with abnormal findings: Secondary | ICD-10-CM | POA: Diagnosis not present

## 2018-05-07 DIAGNOSIS — Z13220 Encounter for screening for lipoid disorders: Secondary | ICD-10-CM

## 2018-05-07 DIAGNOSIS — N644 Mastodynia: Secondary | ICD-10-CM | POA: Diagnosis not present

## 2018-05-07 DIAGNOSIS — E041 Nontoxic single thyroid nodule: Secondary | ICD-10-CM

## 2018-05-07 DIAGNOSIS — O24410 Gestational diabetes mellitus in pregnancy, diet controlled: Secondary | ICD-10-CM | POA: Diagnosis not present

## 2018-05-07 DIAGNOSIS — N63 Unspecified lump in unspecified breast: Secondary | ICD-10-CM

## 2018-05-07 LAB — CBC WITH DIFFERENTIAL/PLATELET
BASOS ABS: 0 10*3/uL (ref 0.0–0.1)
Basophils Relative: 0.8 % (ref 0.0–3.0)
EOS ABS: 0 10*3/uL (ref 0.0–0.7)
Eosinophils Relative: 1 % (ref 0.0–5.0)
HEMATOCRIT: 35.2 % — AB (ref 36.0–46.0)
HEMOGLOBIN: 11.8 g/dL — AB (ref 12.0–15.0)
LYMPHS PCT: 31.2 % (ref 12.0–46.0)
Lymphs Abs: 1.3 10*3/uL (ref 0.7–4.0)
MCHC: 33.4 g/dL (ref 30.0–36.0)
MCV: 93.3 fl (ref 78.0–100.0)
Monocytes Absolute: 0.3 10*3/uL (ref 0.1–1.0)
Monocytes Relative: 6.8 % (ref 3.0–12.0)
Neutro Abs: 2.6 10*3/uL (ref 1.4–7.7)
Neutrophils Relative %: 60.2 % (ref 43.0–77.0)
PLATELETS: 286 10*3/uL (ref 150.0–400.0)
RBC: 3.77 Mil/uL — ABNORMAL LOW (ref 3.87–5.11)
RDW: 14.3 % (ref 11.5–15.5)
WBC: 4.3 10*3/uL (ref 4.0–10.5)

## 2018-05-07 LAB — COMPREHENSIVE METABOLIC PANEL
ALBUMIN: 4.3 g/dL (ref 3.5–5.2)
ALT: 9 U/L (ref 0–35)
AST: 11 U/L (ref 0–37)
Alkaline Phosphatase: 49 U/L (ref 39–117)
BILIRUBIN TOTAL: 0.6 mg/dL (ref 0.2–1.2)
BUN: 14 mg/dL (ref 6–23)
CALCIUM: 9.3 mg/dL (ref 8.4–10.5)
CO2: 24 meq/L (ref 19–32)
CREATININE: 0.77 mg/dL (ref 0.40–1.20)
Chloride: 107 mEq/L (ref 96–112)
GFR: 107.13 mL/min (ref >60.00)
Glucose, Bld: 90 mg/dL (ref 70–99)
Potassium: 4.4 mEq/L (ref 3.5–5.1)
SODIUM: 138 meq/L (ref 135–145)
Total Protein: 6.6 g/dL (ref 6.0–8.3)

## 2018-05-07 LAB — HEMOGLOBIN A1C: Hgb A1c MFr Bld: 5.9 % (ref 4.6–6.5)

## 2018-05-07 LAB — T4, FREE: FREE T4: 1.03 ng/dL (ref 0.60–1.60)

## 2018-05-07 LAB — TSH: TSH: 1.29 u[IU]/mL (ref 0.35–4.50)

## 2018-05-07 NOTE — Progress Notes (Signed)
Subjective:    Mary [name] is a 28 y.o. female and is here for a comprehensive physical exam.  HPI  There are no preventive care reminders to display for this patient.  Acute Concerns: None  Chronic Issues: Gestational diabetes -- had this with her second pregnancy. Diet controlled. Has never been on medication. Enlarged thyroid -- history of this when she was younger and had an xray done of her neck, it was incidental finding. Last thyroid labs were checked last year and were normal. Obesity -- phentermine in the past, was one for about a month. Did tolerate it well. Has never been on any other medications. Would like to go back on this if she can.  Body mass index is 35.43 kg/m.  Health Maintenance: Immunizations -- UTD Colonoscopy -- n/a Mammogram -- n/a PAP -- UTD -- performed on July 2017 Diet -- eats all food groups, was vegetarian at one point Caffeine intake -- cannot tolerate Sleep habits -- no issues Exercise -- goes to [facility] a few days a week Current Weight -- Weight: 200 lb (90.7 kg) ; was down to 180 lb about 4 months ago Weight History: Wt Readings from Last 10 Encounters:  05/07/18 200 lb (90.7 kg)  12/07/16 197 lb 12.8 oz (89.7 kg)  10/03/15 201 lb (91.2 kg)  08/30/12 211 lb (95.7 kg)  08/30/12 211 lb 12.8 oz (96.1 kg)  08/27/12 209 lb 12.8 oz (95.2 kg)  08/25/12 209 lb 1.6 oz (94.8 kg)  08/18/12 204 lb 3.2 oz (92.6 kg)  07/28/12 201 lb 8 oz (91.4 kg)  07/14/12 198 lb 1.6 oz (89.9 kg)  Mood -- good Patient's last menstrual period was 05/07/2018. Period characteristics -- heavy, shorter now down to 4 days (was 7 days) Birth control -- none  Depression screen PHQ 2/9 05/07/2018  Decreased Interest 0  Down, Depressed, Hopeless 0  PHQ - 2 Score 0     Other providers/specialists: Patient Care Team: Jarold Motto, [place] as PCP - General (Physician Assistant)   PMHx, SurgHx, SocialHx, Medications, and Allergies were reviewed in the Visit  Navigator and updated as appropriate.   Past Medical History:  Diagnosis Date  . Gestational diabetes    during both pregnancies  . Pregnancy induced hypertension 06-04-11   gestational hypertention  . Thyroid enlarged      Past Surgical History:  Procedure Laterality Date  . NO PAST SURGERIES    . VAGINAL DELIVERY  2013 2014     Family History  Problem Relation Age of Onset  . Diabetes Father   . Diabetes Paternal Grandfather   . Lupus Paternal Aunt   . Lupus Paternal Grandmother   . Cancer Paternal Grandmother        breast or gyn cancer  . Hypertension Mother   . Diabetes Maternal Grandfather   . Hypertension Maternal Grandfather     Social History   Tobacco Use  . Smoking status: Never Smoker  . Smokeless tobacco: Never Used  Substance Use Topics  . Alcohol use: No  . Drug use: No    Review of Systems:   Review of Systems  Constitutional: Negative for chills, fever, malaise/fatigue and weight loss.  HENT: Negative for hearing loss, sinus pain and sore throat.   Respiratory: Negative for cough, hemoptysis and shortness of breath.   Cardiovascular: Negative for chest pain, palpitations, orthopnea, claudication, leg swelling and PND.  Gastrointestinal: Negative for abdominal pain, constipation, diarrhea, heartburn, nausea and vomiting.  Genitourinary: Negative for  dysuria, frequency and urgency.  Musculoskeletal: Negative for back pain, myalgias and neck pain.  Skin: Negative for itching and rash.  Neurological: Negative for dizziness, tingling, seizures and headaches.  Endo/Heme/Allergies: Negative for polydipsia.  Psychiatric/Behavioral: Negative for depression. The patient is not nervous/anxious.     Objective:   BP 112/80 (BP Location: Left Arm, Patient Position: Sitting, Cuff Size: Normal)   Pulse (!) 57   Temp 98.7 F (37.1 C) (Oral)   Ht  (1.6 m)   Wt 200 lb (90.7 kg)   LMP 05/07/2018   SpO2 98%   BMI 35.43 kg/m   General Appearance:     Alert, cooperative, no distress, appears stated age  Head:    Normocephalic, without obvious abnormality, atraumatic  Eyes:    PERRL, conjunctiva/corneas clear, EOM's intact, fundi    benign, both eyes  Ears:    Normal TM's and external ear canals, both ears  Nose:   Nares normal, septum midline, mucosa normal, no drainage    or sinus tenderness  Throat:   Lips, mucosa, and tongue normal; teeth and gums normal  Neck:   Supple, symmetrical, trachea midline, no adenopathy;    thyroid:  no enlargement/tenderness/nodules; no carotid   bruit or JVD  Back:     Symmetric, no curvature, ROM normal, no CVA tenderness  Lungs:     Clear to auscultation bilaterally, respirations unlabored  Chest Wall:    No tenderness or deformity   Heart:    Regular rate and rhythm, S1 and S2 normal, no murmur, rub   or gallop  Breast Exam:    Lump on R breast at 3 oclock  Abdomen:     Soft, non-tender, bowel sounds active all four quadrants,    no masses, no organomegaly  Genitalia:    Deferred  Rectal:    Deferred  Extremities:   Extremities normal, atraumatic, no cyanosis or edema  Pulses:   2+ and symmetric all extremities  Skin:   Skin color, texture, turgor normal, no rashes or lesions  Lymph nodes:   Cervical, supraclavicular, and axillary nodes normal  Neurologic:   CNII-XII intact, normal strength, sensation and reflexes    throughout    Assessment/Plan:   Mary [name] was seen today for annual exam and weight loss.  Diagnoses and all orders for this visit:  Encounter for general adult medical examination with abnormal findings Today patient counseled on age appropriate routine health concerns for screening and prevention, each reviewed and up to date or declined. Immunizations reviewed and up to date or declined. Labs ordered and reviewed. Risk factors for depression reviewed and negative. Hearing function and visual acuity are intact. ADLs screened and addressed as needed. Functional ability and  level of safety reviewed and appropriate. Education, counseling and referrals performed based on assessed risks today. Patient provided with a copy of personalized plan for preventive services.  Gestational diabetes -     Hemoglobin A1c  Thyroid nodule -     TSH -     T4, free  Encounter for lipid screening for cardiovascular disease  Obesity, unspecified classification, unspecified obesity type, unspecified whether serious comorbidity present Will check TSH and A1c if normal, will consider phentermine. -     CBC with Differential/Platelet -     Comprehensive metabolic panel -     TSH -     T4, free  Breast lump; Breast tenderness Obtain ultrasound. -     US BREAST LTD UNI LEFT INC AXILLA; Future -  US BREAST LTD UNI RIGHT INC AXILLA; Future  Well Adult Exam: Labs ordered: Yes. Patient counseling was done. See below for items discussed. Discussed the patient's BMI. The BMI is not in the acceptable range; BMI management plan is completed Follow up as needed for acute illness.  Patient Counseling:   [x]     Nutrition: Stressed importance of moderation in sodium/caffeine intake, saturated fat and cholesterol, caloric balance, sufficient intake of fresh fruits, vegetables, fiber, calcium, iron, and 1 mg of folate supplement per day (for females capable of pregnancy).   [x]      Stressed the importance of regular exercise.    [x]     Substance Abuse: Discussed cessation/primary prevention of tobacco, alcohol, or other drug use; driving or other dangerous activities under the influence; availability of treatment for abuse.    [x]      Injury prevention: Discussed safety belts, safety helmets, smoke detector, smoking near bedding or upholstery.    [x]      Sexuality: Discussed sexually transmitted diseases, partner selection, use of condoms, avoidance of unintended pregnancy  and contraceptive alternatives.    [x]     Dental health: Discussed importance of regular tooth brushing,  flossing, and dental visits.   [x]      Health maintenance and immunizations reviewed. Please refer to Health maintenance section.    Jarold Motto, PA-C [facility]

## 2018-05-07 NOTE — Patient Instructions (Signed)
It was great to see you!  Please go to the lab for blood work.   Our office will call you with your results unless you have chosen to receive results via MyChart.  If your blood work is normal we will follow-up each year for physicals and as scheduled for chronic medical problems.  If anything is abnormal we will treat accordingly and get you in for a follow-up.  Take care,  Mary [name]  If labs normal, I will send in phentermine. Follow-up in 1 month. You will be contacted about your breast ultrasound.  Health Maintenance, Female Adopting a healthy lifestyle and getting preventive care can go a long way to promote health and wellness. Talk with your health care provider about what schedule of regular examinations is right for you. This is a good chance for you to check in with your provider about disease prevention and staying healthy. In between checkups, there are plenty of things you can do on your own. Experts have done a lot of research about which lifestyle changes and preventive measures are most likely to keep you healthy. Ask your health care provider for more information. Weight and diet Eat a healthy diet  Be sure to include plenty of vegetables, fruits, low-fat dairy products, and lean protein.  Do not eat a lot of foods high in solid fats, added sugars, or salt.  Get regular exercise. This is one of the most important things you can do for your health. ? Most adults should exercise for at least 150 minutes each week. The exercise should increase your heart rate and make you sweat (moderate-intensity exercise). ? Most adults should also do strengthening exercises at least twice a week. This is in addition to the moderate-intensity exercise. Maintain a healthy weight  Body mass index (BMI) is a measurement that can be used to identify possible weight problems. It estimates body fat based on height and weight. Your health care provider can help determine your BMI and help you  achieve or maintain a healthy weight.  For females 54 years of age and older: ? A BMI below 18.5 is considered underweight. ? A BMI of 18.5 to 24.9 is normal. ? A BMI of 25 to 29.9 is considered overweight. ? A BMI of 30 and above is considered obese. Watch levels of cholesterol and blood lipids  You should start having your blood tested for lipids and cholesterol at 29 years of age, then have this test every 5 years.  You may need to have your cholesterol levels checked more often if: ? Your lipid or cholesterol levels are high. ? You are older than 29 years of age. ? You are at high risk for heart disease. Cancer screening Lung Cancer  Lung cancer screening is recommended for adults 18-34 years old who are at high risk for lung cancer because of a history of smoking.  A yearly low-dose CT scan of the lungs is recommended for people who: ? Currently smoke. ? Have quit within the past 15 years. ? Have at least a 30-pack-year history of smoking. A pack year is smoking an average of one pack of cigarettes a day for 1 year.  Yearly screening should continue until it has been 15 years since you quit.  Yearly screening should stop if you develop a health problem that would prevent you from having lung cancer treatment. Breast Cancer  Practice breast self-awareness. This means understanding how your breasts normally appear and feel.  It also means doing regular  breast self-exams. Let your health care provider know about any changes, no matter how small.  If you are in your 20s or 30s, you should have a clinical breast exam (CBE) by a health care provider every 1-3 years as part of a regular health exam.  If you are 79 or older, have a CBE every year. Also consider having a breast X-ray (mammogram) every year.  If you have a family history of breast cancer, talk to your health care provider about genetic screening.  If you are at high risk for breast cancer, talk to your health care  provider about having an MRI and a mammogram every year.  Breast cancer gene (BRCA) assessment is recommended for women who have family members with BRCA-related cancers. BRCA-related cancers include: ? Breast. ? Ovarian. ? Tubal. ? Peritoneal cancers.  Results of the assessment will determine the need for genetic counseling and BRCA1 and BRCA2 testing. Cervical Cancer Your health care provider may recommend that you be screened regularly for cancer of the pelvic organs (ovaries, uterus, and vagina). This screening involves a pelvic examination, including checking for microscopic changes to the surface of your cervix (Pap test). You may be encouraged to have this screening done every 3 years, beginning at age 59.  For women ages 95-65, health care providers may recommend pelvic exams and Pap testing every 3 years, or they may recommend the Pap and pelvic exam, combined with testing for human papilloma virus (HPV), every 5 years. Some types of HPV increase your risk of cervical cancer. Testing for HPV may also be done on women of any age with unclear Pap test results.  Other health care providers may not recommend any screening for nonpregnant women who are considered low risk for pelvic cancer and who do not have symptoms. Ask your health care provider if a screening pelvic exam is right for you.  If you have had past treatment for cervical cancer or a condition that could lead to cancer, you need Pap tests and screening for cancer for at least 20 years after your treatment. If Pap tests have been discontinued, your risk factors (such as having a new sexual partner) need to be reassessed to determine if screening should resume. Some women have medical problems that increase the chance of getting cervical cancer. In these cases, your health care provider may recommend more frequent screening and Pap tests. Colorectal Cancer  This type of cancer can be detected and often prevented.  Routine  colorectal cancer screening usually begins at 29 years of age and continues through 29 years of age.  Your health care provider may recommend screening at an earlier age if you have risk factors for colon cancer.  Your health care provider may also recommend using home test kits to check for hidden blood in the stool.  A small camera at the end of a tube can be used to examine your colon directly (sigmoidoscopy or colonoscopy). This is done to check for the earliest forms of colorectal cancer.  Routine screening usually begins at age 34.  Direct examination of the colon should be repeated every 5-10 years through 29 years of age. However, you may need to be screened more often if early forms of precancerous polyps or small growths are found. Skin Cancer  Check your skin from head to toe regularly.  Tell your health care provider about any new moles or changes in moles, especially if there is a change in a mole's shape or color.  Also tell your health care provider if you have a mole that is larger than the size of a pencil eraser.  Always use sunscreen. Apply sunscreen liberally and repeatedly throughout the day.  Protect yourself by wearing long sleeves, pants, a wide-brimmed hat, and sunglasses whenever you are outside. Heart disease, diabetes, and high blood pressure  High blood pressure causes heart disease and increases the risk of stroke. High blood pressure is more likely to develop in: ? People who have blood pressure in the high end of the normal range (130-139/85-89 mm Hg). ? People who are overweight or obese. ? People who are African American.  If you are 76-34 years of age, have your blood pressure checked every 3-5 years. If you are 70 years of age or older, have your blood pressure checked every year. You should have your blood pressure measured twice-once when you are at a hospital or clinic, and once when you are not at a hospital or clinic. Record the average of the two  measurements. To check your blood pressure when you are not at a hospital or clinic, you can use: ? An automated blood pressure machine at a pharmacy. ? A home blood pressure monitor.  If you are between 59 years and 71 years old, ask your health care provider if you should take aspirin to prevent strokes.  Have regular diabetes screenings. This involves taking a blood sample to check your fasting blood sugar level. ? If you are at a normal weight and have a low risk for diabetes, have this test once every three years after 29 years of age. ? If you are overweight and have a high risk for diabetes, consider being tested at a younger age or more often. Preventing infection Hepatitis B  If you have a higher risk for hepatitis B, you should be screened for this virus. You are considered at high risk for hepatitis B if: ? You were born in a country where hepatitis B is common. Ask your health care provider which countries are considered high risk. ? Your parents were born in a high-risk country, and you have not been immunized against hepatitis B (hepatitis B vaccine). ? You have HIV or AIDS. ? You use needles to inject street drugs. ? You live with someone who has hepatitis B. ? You have had sex with someone who has hepatitis B. ? You get hemodialysis treatment. ? You take certain medicines for conditions, including cancer, organ transplantation, and autoimmune conditions. Hepatitis C  Blood testing is recommended for: ? Everyone born from [birth year] through 1966. ? Anyone with known risk factors for hepatitis C. Sexually transmitted infections (STIs)  You should be screened for sexually transmitted infections (STIs) including gonorrhea and chlamydia if: ? You are sexually active and are younger than 29 years of age. ? You are older than 29 years of age and your health care provider tells you that you are at risk for this type of infection. ? Your sexual activity has changed since you were last  screened and you are at an increased risk for chlamydia or gonorrhea. Ask your health care provider if you are at risk.  If you do not have HIV, but are at risk, it may be recommended that you take a prescription medicine daily to prevent HIV infection. This is called pre-exposure prophylaxis (PrEP). You are considered at risk if: ? You are sexually active and do not regularly use condoms or know the HIV status of your partner(s). ?  You take drugs by injection. ? You are sexually active with a partner who has HIV. Talk with your health care provider about whether you are at high risk of being infected with HIV. If you choose to begin PrEP, you should first be tested for HIV. You should then be tested every 3 months for as long as you are taking PrEP. Pregnancy  If you are premenopausal and you may become pregnant, ask your health care provider about preconception counseling.  If you may become pregnant, take 400 to 800 micrograms (mcg) of folic acid every day.  If you want to prevent pregnancy, talk to your health care provider about birth control (contraception). Osteoporosis and menopause  Osteoporosis is a disease in which the bones lose minerals and strength with aging. This can result in serious bone fractures. Your risk for osteoporosis can be identified using a bone density scan.  If you are 52 years of age or older, or if you are at risk for osteoporosis and fractures, ask your health care provider if you should be screened.  Ask your health care provider whether you should take a calcium or vitamin D supplement to lower your risk for osteoporosis.  Menopause may have certain physical symptoms and risks.  Hormone replacement therapy may reduce some of these symptoms and risks. Talk to your health care provider about whether hormone replacement therapy is right for you. Follow these instructions at home:  Schedule regular health, dental, and eye exams.  Stay current with your  immunizations.  Do not use any tobacco products including cigarettes, chewing tobacco, or electronic cigarettes.  If you are pregnant, do not drink alcohol.  If you are breastfeeding, limit how much and how often you drink alcohol.  Limit alcohol intake to no more than 1 drink per day for nonpregnant women. One drink equals 12 ounces of beer, 5 ounces of wine, or 1 ounces of hard liquor.  Do not use street drugs.  Do not share needles.  Ask your health care provider for help if you need support or information about quitting drugs.  Tell your health care provider if you often feel depressed.  Tell your health care provider if you have ever been abused or do not feel safe at home. This information is not intended to replace advice given to you by your health care provider. Make sure you discuss any questions you have with your health care provider. Document Released: 09/09/2010 Document Revised: 08/02/2015 Document Reviewed: 11/28/2014 Elsevier Interactive Patient Education  2019 [employer].

## 2018-05-12 ENCOUNTER — Encounter: Payer: Self-pay | Admitting: *Deleted

## 2018-05-12 ENCOUNTER — Telehealth: Payer: Self-pay | Admitting: Physician Assistant

## 2018-05-12 NOTE — Telephone Encounter (Signed)
Copied from CRM [id number]. Topic: Quick Communication - See Telephone Encounter >> May 12, 2018  4:45 PM Lorrine Kin, NT wrote: CRM for notification. See Telephone encounter for: 05/12/18. Patient calling in regards to her lab results from 05/07/2018. Lupita Leash tried to reach out to her, but there was an incorrect number. Updated number in system.

## 2018-05-12 NOTE — Telephone Encounter (Signed)
See note

## 2018-05-13 NOTE — Telephone Encounter (Signed)
See result note.  

## 2018-06-01 ENCOUNTER — Encounter: Payer: Self-pay | Admitting: *Deleted

## 2018-07-08 ENCOUNTER — Other Ambulatory Visit: Payer: BC Managed Care – PPO

## 2018-07-08 ENCOUNTER — Inpatient Hospital Stay: Admission: RE | Admit: 2018-07-08 | Payer: BC Managed Care – PPO | Source: Ambulatory Visit

## 2018-08-18 ENCOUNTER — Other Ambulatory Visit: Payer: Self-pay | Admitting: Pediatric Intensive Care

## 2018-08-18 DIAGNOSIS — Z20822 Contact with and (suspected) exposure to covid-19: Secondary | ICD-10-CM

## 2018-08-20 LAB — NOVEL CORONAVIRUS, NAA: SARS-CoV-2, NAA: NOT DETECTED

## 2019-01-27 ENCOUNTER — Other Ambulatory Visit: Payer: Self-pay

## 2019-01-27 DIAGNOSIS — Z20822 Contact with and (suspected) exposure to covid-19: Secondary | ICD-10-CM

## 2019-01-31 ENCOUNTER — Telehealth: Payer: Self-pay | Admitting: Physician Assistant

## 2019-01-31 LAB — NOVEL CORONAVIRUS, NAA: SARS-CoV-2, NAA: NOT DETECTED

## 2019-01-31 NOTE — Telephone Encounter (Signed)
Patient is calling to receive her negative COVID test results. Patient expressed understanding. 

## 2019-03-24 ENCOUNTER — Other Ambulatory Visit: Payer: Self-pay | Admitting: Cardiology

## 2019-03-24 DIAGNOSIS — Z20822 Contact with and (suspected) exposure to covid-19: Secondary | ICD-10-CM

## 2019-03-25 LAB — NOVEL CORONAVIRUS, NAA: SARS-CoV-2, NAA: NOT DETECTED

## 2019-05-10 ENCOUNTER — Encounter: Payer: BC Managed Care – PPO | Admitting: Physician Assistant

## 2019-06-03 ENCOUNTER — Encounter: Payer: BC Managed Care – PPO | Admitting: Physician Assistant

## 2019-06-07 ENCOUNTER — Encounter: Payer: BC Managed Care – PPO | Admitting: Physician Assistant

## 2019-06-08 ENCOUNTER — Encounter: Payer: BC Managed Care – PPO | Admitting: Physician Assistant

## 2019-06-08 NOTE — Progress Notes (Deleted)
I acted as a Education administrator for [employer], PA-C Anselmo Pickler, LPN  Subjective:    Mary [name] is a 29 y.o. female and is here for a comprehensive physical exam.  HPI  Health Maintenance Due  Topic Date Due  . PAP SMEAR-Modifier  10/03/2018    Acute Concerns:   Chronic Issues:   Health Maintenance: Immunizations -- *** Colonoscopy -- *** Mammogram -- *** PAP -- *** Bone Density -- *** Diet -- *** Caffeine intake -- *** Sleep habits -- *** Exercise -- *** Current Weight --    Weight History: Wt Readings from Last 10 Encounters:  05/07/18 200 lb (90.7 kg)  12/07/16 197 lb 12.8 oz (89.7 kg)  10/03/15 201 lb (91.2 kg)  08/30/12 211 lb (95.7 kg)  08/30/12 211 lb 12.8 oz (96.1 kg)  08/27/12 209 lb 12.8 oz (95.2 kg)  08/25/12 209 lb 1.6 oz (94.8 kg)  08/18/12 204 lb 3.2 oz (92.6 kg)  07/28/12 201 lb 8 oz (91.4 kg)  07/14/12 198 lb 1.6 oz (89.9 kg)   Mood -- *** No LMP recorded. Period characteristics -- *** Birth control -- ***  Depression screen PHQ 2/9 05/07/2018  Decreased Interest 0  Down, Depressed, Hopeless 0  PHQ - 2 Score 0     Other providers/specialists: Patient Care Team: Inda Coke, [place] as PCP - General (Physician Assistant)   PMHx, SurgHx, SocialHx, Medications, and Allergies were reviewed in the Visit Navigator and updated as appropriate.   Past Medical History:  Diagnosis Date  . Gestational diabetes    during both pregnancies  . Pregnancy induced hypertension 06-04-11   gestational hypertention  . Thyroid enlarged     Past Surgical History:  Procedure Laterality Date  . NO PAST SURGERIES    . VAGINAL DELIVERY  2013 2014    Family History  Problem Relation Age of Onset  . Diabetes Father   . Diabetes Paternal Grandfather   . Lupus Paternal Aunt   . Lupus Paternal Grandmother   . Cancer Paternal Grandmother        breast or gyn cancer  . Hypertension Mother   . Diabetes Maternal Grandfather   . Hypertension  Maternal Grandfather     Social History   Tobacco Use  . Smoking status: Never Smoker  . Smokeless tobacco: Never Used  Substance Use Topics  . Alcohol use: No  . Drug use: No    Review of Systems:   ROS  Objective:   There were no vitals taken for this visit.  General Appearance:    Alert, cooperative, no distress, appears stated age  Head:    Normocephalic, without obvious abnormality, atraumatic  Eyes:    PERRL, conjunctiva/corneas clear, EOM's intact, fundi    benign, both eyes  Ears:    Normal TM's and external ear canals, both ears  Nose:   Nares normal, septum midline, mucosa normal, no drainage    or sinus tenderness  Throat:   Lips, mucosa, and tongue normal; teeth and gums normal  Neck:   Supple, symmetrical, trachea midline, no adenopathy;    thyroid:  no enlargement/tenderness/nodules; no carotid   bruit or JVD  Back:     Symmetric, no curvature, ROM normal, no CVA tenderness  Lungs:     Clear to auscultation bilaterally, respirations unlabored  Chest Wall:    No tenderness or deformity   Heart:    Regular rate and rhythm, S1 and S2 normal, no murmur, rub   or gallop  Breast Exam:    No tenderness, masses, or nipple abnormality  Abdomen:     Soft, non-tender, bowel sounds active all four quadrants,    no masses, no organomegaly  Genitalia:    Normal female without lesion, discharge or tenderness  Rectal:    Normal tone no masses or tenderness  Extremities:   Extremities normal, atraumatic, no cyanosis or edema  Pulses:   2+ and symmetric all extremities  Skin:   Skin color, texture, turgor normal, no rashes or lesions  Lymph nodes:   Cervical, supraclavicular, and axillary nodes normal  Neurologic:   CNII-XII intact, normal strength, sensation and reflexes    throughout   Results for orders placed or performed in visit on 03/24/19  Novel Coronavirus, NAA ([facility])   Specimen: Nasopharyngeal(NP) swabs in vial transport medium   NASOPHARYNGE  SCREENIN    Result Value Ref Range   SARS-CoV-2, NAA Not Detected Not Detected    Assessment/Plan:   There are no diagnoses linked to this encounter.  Well Adult Exam: Labs ordered: {Yes/No:18319::"Yes"}. Patient counseling was done. See below for items discussed. Discussed the patient's BMI. The {BMI plan (MU NQF measure 421):19504} Follow up {follow-up interval:13814}.  Patient Counseling:   [x]     Nutrition: Stressed importance of moderation in sodium/caffeine intake, saturated fat and cholesterol, caloric balance, sufficient intake of fresh fruits, vegetables, fiber, calcium, iron, and 1 mg of folate supplement per day (for females capable of pregnancy).   [x]      Stressed the importance of regular exercise.    [x]     Substance Abuse: Discussed cessation/primary prevention of tobacco, alcohol, or other drug use; driving or other dangerous activities under the influence; availability of treatment for abuse.    [x]      Injury prevention: Discussed safety belts, safety helmets, smoke detector, smoking near bedding or upholstery.    [x]      Sexuality: Discussed sexually transmitted diseases, partner selection, use of condoms, avoidance of unintended pregnancy  and contraceptive alternatives.    [x]     Dental health: Discussed importance of regular tooth brushing, flossing, and dental visits.   [x]      Health maintenance and immunizations reviewed. Please refer to Health maintenance section.   ***  , PA-C [facility]

## 2019-07-22 ENCOUNTER — Encounter: Payer: BC Managed Care – PPO | Admitting: Physician Assistant

## 2019-08-12 ENCOUNTER — Other Ambulatory Visit: Payer: Self-pay

## 2019-08-12 ENCOUNTER — Ambulatory Visit (INDEPENDENT_AMBULATORY_CARE_PROVIDER_SITE_OTHER): Payer: BC Managed Care – PPO | Admitting: Physician Assistant

## 2019-08-12 ENCOUNTER — Encounter: Payer: Self-pay | Admitting: Physician Assistant

## 2019-08-12 VITALS — BP 120/80 | HR 66 | Temp 98.3°F | Ht 63.0 in | Wt 208.4 lb

## 2019-08-12 DIAGNOSIS — H6692 Otitis media, unspecified, left ear: Secondary | ICD-10-CM

## 2019-08-12 MED ORDER — AMOXICILLIN 875 MG PO TABS: 875.0000 mg | ORAL_TABLET | Freq: Two times a day (BID) | ORAL | 0 refills | Status: DC

## 2019-08-12 NOTE — Patient Instructions (Signed)
It was great to see you!  Start the antibiotic and take ibuprofen around the clock for a day or two to help with inflammation.  Let us know if better by next week.  Take care,  Jarold Motto PA-C

## 2019-08-12 NOTE — Progress Notes (Signed)
Mary [name] is a 29 y.o. female here for a new problem.  I acted as a Neurosurgeon for [employer], PA-C Corky Mull, LPN   History of Present Illness:   Chief Complaint  Patient presents with  . Otalgia    HPI   Otalgia Pt c/o pain left ear 2 nights ago, denies drainage, no fever or chills. Feels severe pressure and popping sensation, with fullness.  Using Tylenol extra strength with relief. Pain was severe, almost caused her to go to the ER.  Past Medical History:  Diagnosis Date  . Gestational diabetes    during both pregnancies  . Pregnancy induced hypertension 06-04-11   gestational hypertention  . Thyroid enlarged      Social History   Tobacco Use  . Smoking status: Never Smoker  . Smokeless tobacco: Never Used  Substance Use Topics  . Alcohol use: No  . Drug use: No    Past Surgical History:  Procedure Laterality Date  . NO PAST SURGERIES    . VAGINAL DELIVERY  2013 2014    Family History  Problem Relation Age of Onset  . Diabetes Father   . Diabetes Paternal Grandfather   . Lupus Paternal Aunt   . Lupus Paternal Grandmother   . Cancer Paternal Grandmother        breast or gyn cancer  . Hypertension Mother   . Diabetes Maternal Grandfather   . Hypertension Maternal Grandfather     Allergies  Allergen Reactions  . Procardia [Nifedipine] Nausea And Vomiting    Can take with zofran per patient    Current Medications:   Current Outpatient Medications:  .  ibuprofen (ADVIL,MOTRIN) 600 MG tablet, Take 1 tablet (600 mg total) by mouth every 6 (six) hours., Disp: 30 tablet, Rfl: 0 .  amoxicillin (AMOXIL) 875 MG tablet, Take 1 tablet (875 mg total) by mouth 2 (two) times daily., Disp: 20 tablet, Rfl: 0   Review of Systems:   ROS  Negative unless otherwise specified per HPI.  Vitals:   Vitals:   08/12/19 1324  BP: 120/80  Pulse: 66  Temp: 98.3 F (36.8 C)  TempSrc: Temporal  SpO2: 98%  Weight: 208 lb 6.1 oz (94.5 kg)  Height: 5'  3" (1.6 m)     Body mass index is 36.91 kg/m.  Physical Exam:   Physical Exam Vitals and nursing note reviewed.  Constitutional:      General: She is not in acute distress.    Appearance: She is well-developed. She is not ill-appearing or toxic-appearing.  HENT:     Head: Normocephalic and atraumatic.     Right Ear: Tympanic membrane, ear canal and external ear normal. Tympanic membrane is not erythematous, retracted or bulging.     Left Ear: Ear canal and external ear normal. A middle ear effusion is present. Tympanic membrane is erythematous. Tympanic membrane is not retracted or bulging.     Nose: Nose normal.     Right Sinus: No maxillary sinus tenderness or frontal sinus tenderness.     Left Sinus: No maxillary sinus tenderness or frontal sinus tenderness.     Mouth/Throat:     Pharynx: Uvula midline. No posterior oropharyngeal erythema.  Eyes:     General: Lids are normal.     Conjunctiva/sclera: Conjunctivae normal.  Neck:     Trachea: Trachea normal.  Cardiovascular:     Rate and Rhythm: Normal rate and regular rhythm.     Heart sounds: Normal heart sounds, S1 normal  and S2 normal.  Pulmonary:     Effort: Pulmonary effort is normal.     Breath sounds: Normal breath sounds. No decreased breath sounds, wheezing, rhonchi or rales.  Lymphadenopathy:     Cervical: No cervical adenopathy.  Skin:    General: Skin is warm and dry.  Neurological:     Mental Status: She is alert.  Psychiatric:        Speech: Speech normal.        Behavior: Behavior normal. Behavior is cooperative.      Assessment and Plan:   Mary [name] was seen today for otalgia.  Diagnoses and all orders for this visit:  Left otitis media, unspecified otitis media type  Other orders -     amoxicillin (AMOXIL) 875 MG tablet; Take 1 tablet (875 mg total) by mouth 2 (two) times daily.   No red flags on exam.  Will initiate Amoxicillin per orders. Recommend OTC ibuprofen for next 1-2 days.  Discussed taking medications as prescribed. Reviewed return precautions including fever, worsening pain, SOB, cough or other concerns. Push fluids and rest. I recommend that patient follow-up if symptoms worsen or persist despite treatment x 7-10 days, sooner if needed.    . Reviewed expectations re: course of current medical issues. . Discussed self-management of symptoms. . Outlined signs and symptoms indicating need for more acute intervention. . Patient verbalized understanding and all questions were answered. . See orders for this visit as documented in the electronic medical record. . Patient received an After-Visit Summary.  CMA or LPN served as scribe during this visit. History, Physical, and Plan performed by medical provider. The above documentation has been reviewed and is accurate and complete.   Inda Coke, PA-C

## 2019-08-23 ENCOUNTER — Encounter: Payer: BC Managed Care – PPO | Admitting: Physician Assistant

## 2019-09-26 ENCOUNTER — Other Ambulatory Visit: Payer: Self-pay

## 2019-09-26 ENCOUNTER — Ambulatory Visit (INDEPENDENT_AMBULATORY_CARE_PROVIDER_SITE_OTHER): Payer: BC Managed Care – PPO | Admitting: Physician Assistant

## 2019-09-26 ENCOUNTER — Encounter: Payer: BC Managed Care – PPO | Admitting: Physician Assistant

## 2019-09-26 ENCOUNTER — Encounter: Payer: Self-pay | Admitting: Physician Assistant

## 2019-09-26 VITALS — BP 118/80 | HR 69 | Temp 98.2°F | Ht 63.0 in | Wt 211.0 lb

## 2019-09-26 DIAGNOSIS — E669 Obesity, unspecified: Secondary | ICD-10-CM | POA: Diagnosis not present

## 2019-09-26 DIAGNOSIS — Z0000 Encounter for general adult medical examination without abnormal findings: Secondary | ICD-10-CM | POA: Diagnosis not present

## 2019-09-26 DIAGNOSIS — Z13220 Encounter for screening for lipoid disorders: Secondary | ICD-10-CM

## 2019-09-26 DIAGNOSIS — E041 Nontoxic single thyroid nodule: Secondary | ICD-10-CM

## 2019-09-26 DIAGNOSIS — O24410 Gestational diabetes mellitus in pregnancy, diet controlled: Secondary | ICD-10-CM

## 2019-09-26 DIAGNOSIS — Z136 Encounter for screening for cardiovascular disorders: Secondary | ICD-10-CM

## 2019-09-26 LAB — LIPID PANEL
Cholesterol: 192 mg/dL (ref 0–200)
HDL: 43.9 mg/dL (ref >39.00)
LDL Cholesterol: 129 mg/dL — ABNORMAL HIGH (ref 0–99)
NonHDL: 147.77
Total CHOL/HDL Ratio: 4
Triglycerides: 94 mg/dL (ref 0.0–149.0)
VLDL: 18.8 mg/dL (ref 0.0–40.0)

## 2019-09-26 LAB — CBC WITH DIFFERENTIAL/PLATELET
Basophils Absolute: 0 10*3/uL (ref 0.0–0.1)
Basophils Relative: 0.6 % (ref 0.0–3.0)
Eosinophils Absolute: 0.1 10*3/uL (ref 0.0–0.7)
Eosinophils Relative: 1.1 % (ref 0.0–5.0)
HCT: 35.3 % — ABNORMAL LOW (ref 36.0–46.0)
Hemoglobin: 11.8 g/dL — ABNORMAL LOW (ref 12.0–15.0)
Lymphocytes Relative: 31.5 % (ref 12.0–46.0)
Lymphs Abs: 1.8 10*3/uL (ref 0.7–4.0)
MCHC: 33.3 g/dL (ref 30.0–36.0)
MCV: 92.5 fl (ref 78.0–100.0)
Monocytes Absolute: 0.3 10*3/uL (ref 0.1–1.0)
Monocytes Relative: 4.5 % (ref 3.0–12.0)
Neutro Abs: 3.6 10*3/uL (ref 1.4–7.7)
Neutrophils Relative %: 62.3 % (ref 43.0–77.0)
Platelets: 309 10*3/uL (ref 150.0–400.0)
RBC: 3.81 Mil/uL — ABNORMAL LOW (ref 3.87–5.11)
RDW: 14.3 % (ref 11.5–15.5)
WBC: 5.8 10*3/uL (ref 4.0–10.5)

## 2019-09-26 LAB — COMPREHENSIVE METABOLIC PANEL
ALT: 10 U/L (ref 0–35)
AST: 13 U/L (ref 0–37)
Albumin: 4.4 g/dL (ref 3.5–5.2)
Alkaline Phosphatase: 59 U/L (ref 39–117)
BUN: 18 mg/dL (ref 6–23)
CO2: 28 mEq/L (ref 19–32)
Calcium: 9.4 mg/dL (ref 8.4–10.5)
Chloride: 103 mEq/L (ref 96–112)
Creatinine, Ser: 0.81 mg/dL (ref 0.40–1.20)
GFR: 100.1 mL/min (ref >60.00)
Glucose, Bld: 92 mg/dL (ref 70–99)
Potassium: 4.4 mEq/L (ref 3.5–5.1)
Sodium: 137 mEq/L (ref 135–145)
Total Bilirubin: 0.5 mg/dL (ref 0.2–1.2)
Total Protein: 7 g/dL (ref 6.0–8.3)

## 2019-09-26 LAB — T4, FREE: Free T4: 1.24 ng/dL (ref 0.60–1.60)

## 2019-09-26 LAB — HEMOGLOBIN A1C: Hgb A1c MFr Bld: 6.1 % (ref 4.6–6.5)

## 2019-09-26 LAB — TSH: TSH: 1.81 u[IU]/mL (ref 0.35–4.50)

## 2019-09-26 NOTE — Progress Notes (Signed)
I acted as a Neurosurgeon for [employer], PA-C Corky Mull, LPN   Subjective:    Mary [name] is a 29 y.o. female and is here for a comprehensive physical exam.  HPI  Health Maintenance Due  Topic Date Due  . Hepatitis C Screening  Never done  . PAP SMEAR-Modifier  10/03/2018  . COVID-19 Vaccine (2 - Pfizer 2-dose series) 09/26/2019    Acute Concerns: None  Chronic Issues: Obesity -- she is working hard on weight loss and is being very intentional about what she is eating.  She is used phentermine in the past and she is interested in considering this. Thyromegaly--history of this.  Last ultrasound was in 2010 that was completely normal.  She routinely gets her thyroid updated and would like labs today. Gestational diabetes --was diet controlled.  Her last hemoglobin A1c was 5.9% last year.  She continues to work on diet and exercise.  Health Maintenance: Immunizations -- UTD, declines Flu Colonoscopy -- N/A Mammogram -- N/A PAP -- done today Bone Density -- N/A Diet -- drinks plenty of water; eats fruit and sardines; not a lot of fried Sleep habits -- no concerns Exercise -- currently working out -- every day x past two months; doing cardio and strength training Current Weight -- Weight: 211 lb (95.7 kg)  Weight History: Wt Readings from Last 10 Encounters:  09/26/19 211 lb (95.7 kg)  08/12/19 208 lb 6.1 oz (94.5 kg)  05/07/18 200 lb (90.7 kg)  12/07/16 197 lb 12.8 oz (89.7 kg)  10/03/15 201 lb (91.2 kg)  08/30/12 211 lb (95.7 kg)  08/30/12 211 lb 12.8 oz (96.1 kg)  08/27/12 209 lb 12.8 oz (95.2 kg)  08/25/12 209 lb 1.6 oz (94.8 kg)  08/18/12 204 lb 3.2 oz (92.6 kg)   Body mass index is 37.38 kg/m. Mood -- no concerns Patient's last menstrual period was 09/21/2019.   Depression screen PHQ 2/9 09/26/2019  Decreased Interest 0  Down, Depressed, Hopeless 0  PHQ - 2 Score 0     Other providers/specialists: Patient Care Team: Jarold Motto, [place]  as PCP - General (Physician Assistant)   PMHx, SurgHx, SocialHx, Medications, and Allergies were reviewed in the Visit Navigator and updated as appropriate.   Past Medical History:  Diagnosis Date  . Gestational diabetes    during both pregnancies  . Pregnancy induced hypertension 06-04-11   gestational hypertention  . Thyroid enlarged      Past Surgical History:  Procedure Laterality Date  . NO PAST SURGERIES    . VAGINAL DELIVERY  2013 2014     Family History  Problem Relation Age of Onset  . Diabetes Paternal Grandfather   . Hypertension Paternal Grandfather   . Lupus Paternal Aunt   . Lupus Paternal Grandmother   . Cancer Paternal Grandmother        breast or gyn cancer  . Hypertension Mother   . Colon cancer Neg Hx     Social History   Tobacco Use  . Smoking status: Never Smoker  . Smokeless tobacco: Never Used  Vaping Use  . Vaping Use: Never used  Substance Use Topics  . Alcohol use: No  . Drug use: No    Review of Systems:   Review of Systems  Constitutional: Negative for chills, fever, malaise/fatigue and weight loss.  HENT: Negative for hearing loss, sinus pain and sore throat.   Respiratory: Negative for cough and hemoptysis.   Cardiovascular: Negative for chest pain, palpitations, leg  swelling and PND.  Gastrointestinal: Negative for abdominal pain, constipation, diarrhea, heartburn, nausea and vomiting.  Genitourinary: Negative for dysuria, frequency and urgency.  Musculoskeletal: Negative for back pain, myalgias and neck pain.  Skin: Negative for itching and rash.  Neurological: Negative for dizziness, tingling, seizures and headaches.  Endo/Heme/Allergies: Negative for polydipsia.  Psychiatric/Behavioral: Negative for depression. The patient is not nervous/anxious.     Objective:   BP 118/80 (BP Location: Left Arm, Patient Position: Sitting, Cuff Size: Large)   Pulse 69   Temp 98.2 F (36.8 C) (Temporal)   Ht 5\' 3"  (1.6 m)   Wt 211 lb  (95.7 kg)   LMP 09/21/2019   SpO2 97%   BMI 37.38 kg/m   General Appearance:    Alert, cooperative, no distress, appears stated age  Head:    Normocephalic, without obvious abnormality, atraumatic  Eyes:    PERRL, conjunctiva/corneas clear, EOM's intact, fundi    benign, both eyes  Ears:    Normal TM's and external ear canals, both ears  Nose:   Nares normal, septum midline, mucosa normal, no drainage    or sinus tenderness  Throat:   Lips, mucosa, and tongue normal; teeth and gums normal  Neck:   Supple, symmetrical, trachea midline, no adenopathy;    thyroid:  no enlargement/tenderness/nodules; no carotid   bruit or JVD  Back:     Symmetric, no curvature, ROM normal, no CVA tenderness  Lungs:     Clear to auscultation bilaterally, respirations unlabored  Chest Wall:    No tenderness or deformity   Heart:    Regular rate and rhythm, S1 and S2 normal, no murmur, rub   or gallop  Breast Exam:    No tenderness, masses, or nipple abnormality  Abdomen:     Soft, non-tender, bowel sounds active all four quadrants,    no masses, no organomegaly  Genitalia:    Deferred  Rectal:    Deferred  Extremities:   Extremities normal, atraumatic, no cyanosis or edema  Pulses:   2+ and symmetric all extremities  Skin:   Skin color, texture, turgor normal, no rashes or lesions  Lymph nodes:   Cervical, supraclavicular, and axillary nodes normal  Neurologic:   CNII-XII intact, normal strength, sensation and reflexes    throughout     Assessment/Plan:   Shunte was seen today for annual exam.  Diagnoses and all orders for this visit:  Routine physical examination Today patient counseled on age appropriate routine health concerns for screening and prevention, each reviewed and up to date or declined. Immunizations reviewed and up to date or declined. Labs ordered and reviewed. Risk factors for depression reviewed and negative. Hearing function and visual acuity are intact. ADLs screened and  addressed as needed. Functional ability and level of safety reviewed and appropriate. Education, counseling and referrals performed based on assessed risks today. Patient provided with a copy of personalized plan for preventive services.  Patient declined Pap --she will schedule at a later date.  Thyroid nodule We will update thyroid levels today.  Continue to monitor, and will provide recommendations based on lab results. -     TSH -     T4, free  Diet controlled gestational diabetes mellitus (GDM), antepartum Update hemoglobin A1c today.  She does not want to take any medications, encouraged continued lifestyle efforts. -     Hemoglobin A1c  Encounter for lipid screening for cardiovascular disease -     Lipid panel  Obesity, unspecified classification, unspecified  obesity type, unspecified whether serious comorbidity present Continue to work on diet and exercise. -     CBC with Differential/Platelet -     Comprehensive metabolic panel    Well Adult Exam: Labs ordered: Yes. Patient counseling was done. See below for items discussed. Discussed the patient's BMI. The BMI is not in the acceptable range; BMI management plan is completed Follow up in one year.  Patient Counseling:   [x]     Nutrition: Stressed importance of moderation in sodium/caffeine intake, saturated fat and cholesterol, caloric balance, sufficient intake of fresh fruits, vegetables, fiber, calcium, iron, and 1 mg of folate supplement per day (for females capable of pregnancy).   [x]      Stressed the importance of regular exercise.    [x]     Substance Abuse: Discussed cessation/primary prevention of tobacco, alcohol, or other drug use; driving or other dangerous activities under the influence; availability of treatment for abuse.    [x]      Injury prevention: Discussed safety belts, safety helmets, smoke detector, smoking near bedding or upholstery.    [x]      Sexuality: Discussed sexually transmitted diseases,  partner selection, use of condoms, avoidance of unintended pregnancy  and contraceptive alternatives.    [x]     Dental health: Discussed importance of regular tooth brushing, flossing, and dental visits.   [x]      Health maintenance and immunizations reviewed. Please refer to Health maintenance section.   CMA or LPN served as scribe during this visit. History, Physical, and Plan performed by medical provider. The above documentation has been reviewed and is accurate and complete.   , PA-C [facility]

## 2019-09-26 NOTE — Patient Instructions (Addendum)
It was great to see you!  169 lb will get you to "overweight category"  Please go to the lab for blood work.   Our office will call you with your results unless you have chosen to receive results via MyChart.  If your blood work is normal we will follow-up each year for physicals and as scheduled for chronic medical problems.  If anything is abnormal we will treat accordingly and get you in for a follow-up.  Take care,  [facility] Maintenance, Female Adopting a healthy lifestyle and getting preventive care are important in promoting health and wellness. Ask your health care provider about:  The right schedule for you to have regular tests and exams.  Things you can do on your own to prevent diseases and keep yourself healthy. What should I know about diet, weight, and exercise? Eat a healthy diet   Eat a diet that includes plenty of vegetables, fruits, low-fat dairy products, and lean protein.  Do not eat a lot of foods that are high in solid fats, added sugars, or sodium. Maintain a healthy weight Body mass index (BMI) is used to identify weight problems. It estimates body fat based on height and weight. Your health care provider can help determine your BMI and help you achieve or maintain a healthy weight. Get regular exercise Get regular exercise. This is one of the most important things you can do for your health. Most adults should:  Exercise for at least 150 minutes each week. The exercise should increase your heart rate and make you sweat (moderate-intensity exercise).  Do strengthening exercises at least twice a week. This is in addition to the moderate-intensity exercise.  Spend less time sitting. Even light physical activity can be beneficial. Watch cholesterol and blood lipids Have your blood tested for lipids and cholesterol at 30 years of age, then have this test every 5 years. Have your cholesterol levels checked more often if:  Your lipid or  cholesterol levels are high.  You are older than 30 years of age.  You are at high risk for heart disease. What should I know about cancer screening? Depending on your health history and family history, you may need to have cancer screening at various ages. This may include screening for:  Breast cancer.  Cervical cancer.  Colorectal cancer.  Skin cancer.  Lung cancer. What should I know about heart disease, diabetes, and high blood pressure? Blood pressure and heart disease  High blood pressure causes heart disease and increases the risk of stroke. This is more likely to develop in people who have high blood pressure readings, are of African descent, or are overweight.  Have your blood pressure checked: ? Every 3-5 years if you are 38-54 years of age. ? Every year if you are 57 years old or older. Diabetes Have regular diabetes screenings. This checks your fasting blood sugar level. Have the screening done:  Once every three years after age 60 if you are at a normal weight and have a low risk for diabetes.  More often and at a younger age if you are overweight or have a high risk for diabetes. What should I know about preventing infection? Hepatitis B If you have a higher risk for hepatitis B, you should be screened for this virus. Talk with your health care provider to find out if you are at risk for hepatitis B infection. Hepatitis C Testing is recommended for:  Everyone born from [birth year] through  68.  Anyone with known risk factors for hepatitis C. Sexually transmitted infections (STIs)  Get screened for STIs, including gonorrhea and chlamydia, if: ? You are sexually active and are younger than 30 years of age. ? You are older than 30 years of age and your health care provider tells you that you are at risk for this type of infection. ? Your sexual activity has changed since you were last screened, and you are at increased risk for chlamydia or gonorrhea. Ask your  health care provider if you are at risk.  Ask your health care provider about whether you are at high risk for HIV. Your health care provider may recommend a prescription medicine to help prevent HIV infection. If you choose to take medicine to prevent HIV, you should first get tested for HIV. You should then be tested every 3 months for as long as you are taking the medicine. Pregnancy  If you are about to stop having your period (premenopausal) and you may become pregnant, seek counseling before you get pregnant.  Take 400 to 800 micrograms (mcg) of folic acid every day if you become pregnant.  Ask for birth control (contraception) if you want to prevent pregnancy. Osteoporosis and menopause Osteoporosis is a disease in which the bones lose minerals and strength with aging. This can result in bone fractures. If you are 16 years old or older, or if you are at risk for osteoporosis and fractures, ask your health care provider if you should:  Be screened for bone loss.  Take a calcium or vitamin D supplement to lower your risk of fractures.  Be given hormone replacement therapy (HRT) to treat symptoms of menopause. Follow these instructions at home: Lifestyle  Do not use any products that contain nicotine or tobacco, such as cigarettes, e-cigarettes, and chewing tobacco. If you need help quitting, ask your health care provider.  Do not use street drugs.  Do not share needles.  Ask your health care provider for help if you need support or information about quitting drugs. Alcohol use  Do not drink alcohol if: ? Your health care provider tells you not to drink. ? You are pregnant, may be pregnant, or are planning to become pregnant.  If you drink alcohol: ? Limit how much you use to 0-1 drink a day. ? Limit intake if you are breastfeeding.  Be aware of how much alcohol is in your drink. In the [place] one drink equals one 12 oz bottle of beer (355 mL), one 5 oz glass of wine (148  mL), or one 1 oz glass of hard liquor (44 mL). General instructions  Schedule regular health, dental, and eye exams.  Stay current with your vaccines.  Tell your health care provider if: ? You often feel depressed. ? You have ever been abused or do not feel safe at home. Summary  Adopting a healthy lifestyle and getting preventive care are important in promoting health and wellness.  Follow your health care provider's instructions about healthy diet, exercising, and getting tested or screened for diseases.  Follow your health care provider's instructions on monitoring your cholesterol and blood pressure. This information is not intended to replace advice given to you by your health care provider. Make sure you discuss any questions you have with your health care provider. Document Revised: 02/17/2018 Document Reviewed: 02/17/2018 Elsevier Patient Education  2020 [employer].

## 2019-09-26 NOTE — Progress Notes (Deleted)
I acted as a Neurosurgeon for [employer], PA-C Corky Mull, LPN   Subjective:    Mary [name] is a 29 y.o. female and is here for a comprehensive physical exam.  HPI  Health Maintenance Due  Topic Date Due  . Hepatitis C Screening  Never done  . COVID-19 Vaccine (1) Never done  . PAP SMEAR-Modifier  10/03/2018    Acute Concerns:   Chronic Issues:   Health Maintenance: Immunizations -- UTD Colonoscopy -- N/A Mammogram -- N/A PAP --  Bone Density -- N/A Diet -- *** Caffeine intake -- *** Sleep habits -- *** Exercise -- *** Current Weight --    Weight History: Wt Readings from Last 10 Encounters:  08/12/19 208 lb 6.1 oz (94.5 kg)  05/07/18 200 lb (90.7 kg)  12/07/16 197 lb 12.8 oz (89.7 kg)  10/03/15 201 lb (91.2 kg)  08/30/12 211 lb (95.7 kg)  08/30/12 211 lb 12.8 oz (96.1 kg)  08/27/12 209 lb 12.8 oz (95.2 kg)  08/25/12 209 lb 1.6 oz (94.8 kg)  08/18/12 204 lb 3.2 oz (92.6 kg)  07/28/12 201 lb 8 oz (91.4 kg)   There is no height or weight on file to calculate BMI. Mood -- *** No LMP recorded. Period characteristics -- *** Birth control -- ***  Depression screen PHQ 2/9 05/07/2018  Decreased Interest 0  Down, Depressed, Hopeless 0  PHQ - 2 Score 0     Other providers/specialists: Patient Care Team: Jarold Motto, [place] as PCP - General (Physician Assistant)   PMHx, SurgHx, SocialHx, Medications, and Allergies were reviewed in the Visit Navigator and updated as appropriate.   Past Medical History:  Diagnosis Date  . Gestational diabetes    during both pregnancies  . Pregnancy induced hypertension 06-04-11   gestational hypertention  . Thyroid enlarged     Past Surgical History:  Procedure Laterality Date  . NO PAST SURGERIES    . VAGINAL DELIVERY  2013 2014    Family History  Problem Relation Age of Onset  . Diabetes Father   . Diabetes Paternal Grandfather   . Lupus Paternal Aunt   . Lupus Paternal Grandmother   . Cancer  Paternal Grandmother        breast or gyn cancer  . Hypertension Mother   . Diabetes Maternal Grandfather   . Hypertension Maternal Grandfather     Social History   Tobacco Use  . Smoking status: Never Smoker  . Smokeless tobacco: Never Used  Vaping Use  . Vaping Use: Never used  Substance Use Topics  . Alcohol use: No  . Drug use: No    Review of Systems:   ROS  Objective:   There were no vitals taken for this visit.  General Appearance:    Alert, cooperative, no distress, appears stated age  Head:    Normocephalic, without obvious abnormality, atraumatic  Eyes:    PERRL, conjunctiva/corneas clear, EOM's intact, fundi    benign, both eyes  Ears:    Normal TM's and external ear canals, both ears  Nose:   Nares normal, septum midline, mucosa normal, no drainage    or sinus tenderness  Throat:   Lips, mucosa, and tongue normal; teeth and gums normal  Neck:   Supple, symmetrical, trachea midline, no adenopathy;    thyroid:  no enlargement/tenderness/nodules; no carotid   bruit or JVD  Back:     Symmetric, no curvature, ROM normal, no CVA tenderness  Lungs:     Clear  to auscultation bilaterally, respirations unlabored  Chest Wall:    No tenderness or deformity   Heart:    Regular rate and rhythm, S1 and S2 normal, no murmur, rub   or gallop  Breast Exam:    No tenderness, masses, or nipple abnormality  Abdomen:     Soft, non-tender, bowel sounds active all four quadrants,    no masses, no organomegaly  Genitalia:    Normal female without lesion, discharge or tenderness  Rectal:    Normal tone no masses or tenderness  Extremities:   Extremities normal, atraumatic, no cyanosis or edema  Pulses:   2+ and symmetric all extremities  Skin:   Skin color, texture, turgor normal, no rashes or lesions  Lymph nodes:   Cervical, supraclavicular, and axillary nodes normal  Neurologic:   CNII-XII intact, normal strength, sensation and reflexes    throughout   Results for orders  placed or performed in visit on 03/24/19  Novel Coronavirus, NAA ([facility])   Specimen: Nasopharyngeal(NP) swabs in vial transport medium   NASOPHARYNGE  SCREENIN  Result Value Ref Range   SARS-CoV-2, NAA Not Detected Not Detected    Assessment/Plan:   There are no diagnoses linked to this encounter.  Well Adult Exam: Labs ordered: {Yes/No:18319::"Yes"}. Patient counseling was done. See below for items discussed. Discussed the patient's BMI. The {BMI plan (MU NQF measure 421):19504} Follow up {follow-up interval:13814}.  Patient Counseling:   [x]     Nutrition: Stressed importance of moderation in sodium/caffeine intake, saturated fat and cholesterol, caloric balance, sufficient intake of fresh fruits, vegetables, fiber, calcium, iron, and 1 mg of folate supplement per day (for females capable of pregnancy).   [x]      Stressed the importance of regular exercise.    [x]     Substance Abuse: Discussed cessation/primary prevention of tobacco, alcohol, or other drug use; driving or other dangerous activities under the influence; availability of treatment for abuse.    [x]      Injury prevention: Discussed safety belts, safety helmets, smoke detector, smoking near bedding or upholstery.    [x]      Sexuality: Discussed sexually transmitted diseases, partner selection, use of condoms, avoidance of unintended pregnancy  and contraceptive alternatives.    [x]     Dental health: Discussed importance of regular tooth brushing, flossing, and dental visits.   [x]      Health maintenance and immunizations reviewed. Please refer to Health maintenance section.   ***  , PA-C [facility]

## 2020-02-24 ENCOUNTER — Ambulatory Visit: Payer: BC Managed Care – PPO | Admitting: Physician Assistant

## 2020-03-05 ENCOUNTER — Ambulatory Visit: Payer: BC Managed Care – PPO | Admitting: Physician Assistant

## 2020-05-11 ENCOUNTER — Telehealth: Payer: BC Managed Care – PPO | Admitting: Physician Assistant

## 2020-05-11 ENCOUNTER — Encounter: Payer: Self-pay | Admitting: Physician Assistant

## 2020-05-11 ENCOUNTER — Telehealth (INDEPENDENT_AMBULATORY_CARE_PROVIDER_SITE_OTHER): Payer: BC Managed Care – PPO | Admitting: Physician Assistant

## 2020-05-11 VITALS — Ht 63.0 in | Wt 190.0 lb

## 2020-05-11 DIAGNOSIS — E010 Iodine-deficiency related diffuse (endemic) goiter: Secondary | ICD-10-CM

## 2020-05-11 DIAGNOSIS — R5383 Other fatigue: Secondary | ICD-10-CM

## 2020-05-11 NOTE — Progress Notes (Signed)
   TELEPHONE ENCOUNTER   Patient verbally agreed to telephone visit and is aware that copayment and coinsurance may apply. Patient was treated using telemedicine according to accepted telemedicine protocols.  Location of the patient: home Location of provider: [facility] [address] [facility] [employer] of all persons participating in the telemedicine service and role in the encounter: Jarold Motto, [place] , Isabelle Course  Subjective:   Chief Complaint  Patient presents with  . Thyroid Problem     HPI   Thyroid problem Pt says she has an enlarged thyroid and is wanting to keep up on top of it and is due for testing. Her blood work was checked in July and TSH and free T4 were normal. Last u/s of thyroid was in 2010. Per chart, her u/s was normal. She denies: skin changes, bowel changes, temperature changes.   She is actively trying to lose weight. She is pre-diabetic and is currently trying to get to goal weight of 160 lb.  She has been getting tired a lot recently. Started taking OTC vitamins. Trying to go bed early. She is exercising regularly. Periods are shorter than usual, not heavy, last about 4 days. She does pass a few clots. She has a history of decreased hgb. Currently not on dedicated iron supplement. Denies: lightheadedness, palpitations.  Wt Readings from Last 3 Encounters:  05/11/20 190 lb (86.2 kg)  09/26/19 211 lb (95.7 kg)  08/12/19 208 lb 6.1 oz (94.5 kg)      Patient Active Problem List   Diagnosis Date Noted  . Umbilical hernia 05/26/2012  . Infections of genitourinary tract antepartum 05/26/2012  . Supervision of normal subsequent pregnancy 03/31/2012  . 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 Smoking status: Never Smoker  . Smokeless tobacco: Never Used  Substance Use Topics  . Alcohol use: No    Current Outpatient Medications:  .  BIOTIN PO, Take 2 each by mouth daily., Disp: , Rfl:  .  ibuprofen (ADVIL,MOTRIN) 600 MG tablet, Take 1 tablet  (600 mg total) by mouth every 6 (six) hours., Disp: 30 tablet, Rfl: 0 .  Multiple Vitamin (MULTIVITAMIN) tablet, Take 1 tablet by mouth daily., Disp: , Rfl:  Allergies  Allergen Reactions  . Procardia [Nifedipine] Nausea And Vomiting    Can take with zofran per patient    Assessment & Plan:   1. Thyromegaly  Reviewed recent blood work with patient and provided reassurance. U/S was normal as well, but low threshold to recheck if concerns with sx or exam.  2. Fatigue, unspecified type  No red flags on discussion. She is trying to be more intentional about healthy lifestyle with exercise. Reviewed recent CBC results, recommend trialing every other day OTC iron supplement to see if this helps symptoms. If no improvement in symptoms, please let us know.     No orders of the defined types were placed in this encounter.  No orders of the defined types were placed in this encounter.   Jarold Motto, [place] 05/11/2020  Time spent with the patient: 6 minutes, spent in obtaining information about her symptoms, reviewing her previous labs, evaluations, and treatments, counseling her about her condition (please see the discussed topics above), and developing a plan to further investigate it; she had a number of questions which I addressed.

## 2020-09-27 ENCOUNTER — Encounter: Payer: Self-pay | Admitting: Family

## 2020-09-27 ENCOUNTER — Ambulatory Visit (INDEPENDENT_AMBULATORY_CARE_PROVIDER_SITE_OTHER): Payer: BC Managed Care – PPO | Admitting: Family

## 2020-09-27 ENCOUNTER — Other Ambulatory Visit: Payer: Self-pay

## 2020-09-27 VITALS — BP 128/81 | HR 89 | Temp 98.0°F | Ht 63.0 in | Wt 206.2 lb

## 2020-09-27 DIAGNOSIS — Z136 Encounter for screening for cardiovascular disorders: Secondary | ICD-10-CM

## 2020-09-27 DIAGNOSIS — Z0001 Encounter for general adult medical examination with abnormal findings: Secondary | ICD-10-CM

## 2020-09-27 DIAGNOSIS — E010 Iodine-deficiency related diffuse (endemic) goiter: Secondary | ICD-10-CM | POA: Diagnosis not present

## 2020-09-27 DIAGNOSIS — E041 Nontoxic single thyroid nodule: Secondary | ICD-10-CM | POA: Diagnosis not present

## 2020-09-27 DIAGNOSIS — Z13220 Encounter for screening for lipoid disorders: Secondary | ICD-10-CM

## 2020-09-27 DIAGNOSIS — R739 Hyperglycemia, unspecified: Secondary | ICD-10-CM

## 2020-09-27 LAB — COMPREHENSIVE METABOLIC PANEL
ALT: 10 U/L (ref 0–35)
AST: 16 U/L (ref 0–37)
Albumin: 4.2 g/dL (ref 3.5–5.2)
Alkaline Phosphatase: 51 U/L (ref 39–117)
BUN: 15 mg/dL (ref 6–23)
CO2: 27 mEq/L (ref 19–32)
Calcium: 9.3 mg/dL (ref 8.4–10.5)
Chloride: 103 mEq/L (ref 96–112)
Creatinine, Ser: 0.89 mg/dL (ref 0.40–1.20)
GFR: 86.32 mL/min (ref >60.00)
Glucose, Bld: 78 mg/dL (ref 70–99)
Potassium: 3.9 mEq/L (ref 3.5–5.1)
Sodium: 138 mEq/L (ref 135–145)
Total Bilirubin: 0.6 mg/dL (ref 0.2–1.2)
Total Protein: 6.9 g/dL (ref 6.0–8.3)

## 2020-09-27 LAB — LIPID PANEL
Cholesterol: 172 mg/dL (ref 0–200)
HDL: 37.4 mg/dL — ABNORMAL LOW (ref >39.00)
LDL Cholesterol: 105 mg/dL — ABNORMAL HIGH (ref 0–99)
NonHDL: 134.43
Total CHOL/HDL Ratio: 5
Triglycerides: 146 mg/dL (ref 0.0–149.0)
VLDL: 29.2 mg/dL (ref 0.0–40.0)

## 2020-09-27 LAB — CBC WITH DIFFERENTIAL/PLATELET
Basophils Absolute: 0 10*3/uL (ref 0.0–0.1)
Basophils Relative: 0.7 % (ref 0.0–3.0)
Eosinophils Absolute: 0.1 10*3/uL (ref 0.0–0.7)
Eosinophils Relative: 1.4 % (ref 0.0–5.0)
HCT: 34.6 % — ABNORMAL LOW (ref 36.0–46.0)
Hemoglobin: 11.6 g/dL — ABNORMAL LOW (ref 12.0–15.0)
Lymphocytes Relative: 39.7 % (ref 12.0–46.0)
Lymphs Abs: 1.9 10*3/uL (ref 0.7–4.0)
MCHC: 33.7 g/dL (ref 30.0–36.0)
MCV: 92.5 fl (ref 78.0–100.0)
Monocytes Absolute: 0.2 10*3/uL (ref 0.1–1.0)
Monocytes Relative: 5.2 % (ref 3.0–12.0)
Neutro Abs: 2.5 10*3/uL (ref 1.4–7.7)
Neutrophils Relative %: 53 % (ref 43.0–77.0)
Platelets: 291 10*3/uL (ref 150.0–400.0)
RBC: 3.73 Mil/uL — ABNORMAL LOW (ref 3.87–5.11)
RDW: 14.2 % (ref 11.5–15.5)
WBC: 4.8 10*3/uL (ref 4.0–10.5)

## 2020-09-27 LAB — HEMOGLOBIN A1C: Hgb A1c MFr Bld: 6.1 % (ref 4.6–6.5)

## 2020-09-27 LAB — TSH: TSH: 1.4 u[IU]/mL (ref 0.35–5.50)

## 2020-09-27 NOTE — Progress Notes (Signed)
Acute Office Visit  Subjective:    Patient ID: Mary [name], female    DOB: Aug 15, 1990, 30 y.o.   MRN: [id number]  Chief Complaint  Patient presents with  . Annual Exam    HPI Patient is in today for complete physical exam.  Patient denies any concerns.  She has a history of thyromegaly and gestational diabetes.  Reports exercising over the last 3 weeks and doing some intermittent fasting.  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  Has 2 children.  She is currently on her menses today and declines Pap smear but will return in the future to have it performed.  Past Medical History:  Diagnosis Date  . Gestational diabetes    during both pregnancies  . Pregnancy induced hypertension 06-04-11   gestational hypertention  . Thyroid enlarged     Past Surgical History:  Procedure Laterality Date  . NO PAST SURGERIES    . VAGINAL DELIVERY  2013 2014    Family History  Problem Relation Age of Onset  . Diabetes Paternal Grandfather   . Hypertension Paternal Grandfather   . Lupus Paternal Aunt   . Lupus Paternal Grandmother   . Cancer Paternal Grandmother        breast or gyn cancer  . Hypertension Mother   . Colon cancer Neg Hx     Social History   Socioeconomic History  . Marital status: Single    Spouse name: Not on file  . Number of children: 2  . Years of education: Not on file  . Highest education level: Not on file  Occupational History  . Not on file  Tobacco Use  . Smoking status: Never  . Smokeless tobacco: Never  Vaping Use  . Vaping Use: Never used  Substance and Sexual Activity  . Alcohol use: No  . Drug use: No  . Sexual activity: Not Currently    Birth control/protection: None  Other Topics Concern  . Not on file  Social History Narrative   Boy and girl ages 27 and 66   Single    Music teacher   Social Determinants of Health   Financial Resource Strain: Not on file  Food Insecurity: Not on file   Transportation Needs: Not on file  Physical Activity: Not on file  Stress: Not on file  Social Connections: Not on file  Intimate Partner Violence: Not on file    Outpatient Medications Prior to Visit  Medication Sig Dispense Refill  . BIOTIN PO Take 2 each by mouth daily.    Marland Kitchen ibuprofen (ADVIL,MOTRIN) 600 MG tablet Take 1 tablet (600 mg total) by mouth every 6 (six) hours. 30 tablet 0  . Multiple Vitamin (MULTIVITAMIN) tablet Take 1 tablet by mouth daily.     No facility-administered medications prior to visit.    Allergies  Allergen Reactions  . Procardia [Nifedipine] Nausea And Vomiting    Can take with zofran per patient    Review of Systems  All other systems reviewed and are negative.     Objective:    Physical Exam Vitals and nursing note reviewed. Exam conducted with a chaperone present.  Constitutional:      Appearance: Normal appearance. She is obese.  HENT:     Head: Normocephalic and atraumatic.     Right Ear: Tympanic membrane, ear canal and external ear normal.     Left Ear: Tympanic membrane, ear canal and external ear normal.  Nose: Nose normal.     Mouth/Throat:     Mouth: Mucous membranes are dry.  Eyes:     Extraocular Movements: Extraocular movements intact.     Pupils: Pupils are equal, round, and reactive to light.  Cardiovascular:     Rate and Rhythm: Normal rate and regular rhythm.     Pulses: Normal pulses.     Heart sounds: Normal heart sounds.  Pulmonary:     Effort: Pulmonary effort is normal.     Breath sounds: Normal breath sounds.  Chest:  Breasts:    Right: Normal. No swelling, inverted nipple, mass, nipple discharge, skin change or tenderness.     Left: Normal. No swelling, inverted nipple, mass, nipple discharge, skin change or tenderness.  Abdominal:     General: Abdomen is flat.  Musculoskeletal:        General: Normal range of motion.     Cervical back: Normal range of motion and neck supple.  Skin:    General: Skin  is warm and dry.  Neurological:     General: No focal deficit present.     Mental Status: She is alert and oriented to person, place, and time.  Psychiatric:        Mood and Affect: Mood normal.        Behavior: Behavior normal.   BP 128/81   Pulse 89   Temp 98 F (36.7 C) (Temporal)   Ht 5\' 3"  (1.6 m)   Wt 206 lb 3.2 oz (93.5 kg)   LMP 09/27/2020   SpO2 95%   BMI 36.53 kg/m  Wt Readings from Last 3 Encounters:  09/27/20 206 lb 3.2 oz (93.5 kg)  05/11/20 190 lb (86.2 kg)  09/26/19 211 lb (95.7 kg)    Health Maintenance Due  Topic Date Due  . URINE MICROALBUMIN  Never done  . Hepatitis C Screening  Never done  . PAP SMEAR-Modifier  10/03/2018  . COVID-19 Vaccine (3 - Booster for Pfizer series) 02/28/2020    There are no preventive care reminders to display for this patient.   Lab Results  Component Value Date   TSH 1.81 09/26/2019   Lab Results  Component Value Date   WBC 5.8 09/26/2019   HGB 11.8 (L) 09/26/2019   HCT 35.3 (L) 09/26/2019   MCV 92.5 09/26/2019   PLT 309.0 09/26/2019   Lab Results  Component Value Date   NA 137 09/26/2019   K 4.4 09/26/2019   CO2 28 09/26/2019   GLUCOSE 92 09/26/2019   BUN 18 09/26/2019   CREATININE 0.81 09/26/2019   BILITOT 0.5 09/26/2019   ALKPHOS 59 09/26/2019   AST 13 09/26/2019   ALT 10 09/26/2019   PROT 7.0 09/26/2019   ALBUMIN 4.4 09/26/2019   CALCIUM 9.4 09/26/2019   GFR 100.10 09/26/2019   Lab Results  Component Value Date   CHOL 192 09/26/2019   Lab Results  Component Value Date   HDL 43.90 09/26/2019   Lab Results  Component Value Date   LDLCALC 129 (H) 09/26/2019   Lab Results  Component Value Date   TRIG 94.0 09/26/2019   Lab Results  Component Value Date   CHOLHDL 4 09/26/2019   Lab Results  Component Value Date   HGBA1C 6.1 09/26/2019       Assessment & Plan:   Problem List Items Addressed This Visit   None Visit Diagnoses     Thyromegaly    -  Primary   Relevant Orders  TSH   US THYROID   Encounter for lipid screening for cardiovascular disease       Relevant Orders   Lipid panel   Thyroid nodule       Relevant Orders   TSH   Encounter for general adult medical examination with abnormal findings       Relevant Orders   CBC with Differential   Comprehensive metabolic panel   TSH   Lipid panel   HIV antibody   Hepatitis C antibody screen      Plan: Encouraged healthy diet, exercise, monthly self breast exams.  Return for Pap smear.  Labs obtained today will notify patient pending results.  Ultrasound ordered today to evaluate thyromegaly.  Follow-up pending results  No orders of the defined types were placed in this encounter.    Eulis Foster, FNP

## 2020-09-27 NOTE — Patient Instructions (Addendum)
Mary [name], we need to be sure to schedule a pap smear.   Perform monthly self breast exams.  Great job with your exercise and diet! Keep up the good work.     Health Maintenance, Female Adopting a healthy lifestyle and getting preventive care are important in promoting health and wellness. Ask your health care provider about: The right schedule for you to have regular tests and exams. Things you can do on your own to prevent diseases and keep yourself healthy. What should I know about diet, weight, and exercise? Eat a healthy diet  Eat a diet that includes plenty of vegetables, fruits, low-fat dairy products, and lean protein. Do not eat a lot of foods that are high in solid fats, added sugars, or sodium.  Maintain a healthy weight Body mass index (BMI) is used to identify weight problems. It estimates body fat based on height and weight. Your health care provider can help determineyour BMI and help you achieve or maintain a healthy weight. Get regular exercise Get regular exercise. This is one of the most important things you can do for your health. Most adults should: Exercise for at least 150 minutes each week. The exercise should increase your heart rate and make you sweat (moderate-intensity exercise). Do strengthening exercises at least twice a week. This is in addition to the moderate-intensity exercise. Spend less time sitting. Even light physical activity can be beneficial. Watch cholesterol and blood lipids Have your blood tested for lipids and cholesterol at 31 years of age, then havethis test every 5 years. Have your cholesterol levels checked more often if: Your lipid or cholesterol levels are high. You are older than 31 years of age. You are at high risk for heart disease. What should I know about cancer screening? Depending on your health history and family history, you may need to have cancer screening at various ages. This may include screening for: Breast  cancer. Cervical cancer. Colorectal cancer. Skin cancer. Lung cancer. What should I know about heart disease, diabetes, and high blood pressure? Blood pressure and heart disease High blood pressure causes heart disease and increases the risk of stroke. This is more likely to develop in people who have high blood pressure readings, are of African descent, or are overweight. Have your blood pressure checked: Every 3-5 years if you are 47-39 years of age. Every year if you are 47 years old or older. Diabetes Have regular diabetes screenings. This checks your fasting blood sugar level. Have the screening done: Once every three years after age 36 if you are at a normal weight and have a low risk for diabetes. More often and at a younger age if you are overweight or have a high risk for diabetes. What should I know about preventing infection? Hepatitis B If you have a higher risk for hepatitis B, you should be screened for this virus. Talk with your health care provider to find out if you are at risk forhepatitis B infection. Hepatitis C Testing is recommended for: Everyone born from [birth year] through 1966. Anyone with known risk factors for hepatitis C. Sexually transmitted infections (STIs) Get screened for STIs, including gonorrhea and chlamydia, if: You are sexually active and are younger than 31 years of age. You are older than 31 years of age and your health care provider tells you that you are at risk for this type of infection. Your sexual activity has changed since you were last screened, and you are at increased risk for chlamydia or gonorrhea.  Ask your health care provider if you are at risk. Ask your health care provider about whether you are at high risk for HIV. Your health care provider may recommend a prescription medicine to help prevent HIV infection. If you choose to take medicine to prevent HIV, you should first get tested for HIV. You should then be tested every 3 months for as  long as you are taking the medicine. Pregnancy If you are about to stop having your period (premenopausal) and you may become pregnant, seek counseling before you get pregnant. Take 400 to 800 micrograms (mcg) of folic acid every day if you become pregnant. Ask for birth control (contraception) if you want to prevent pregnancy. Osteoporosis and menopause Osteoporosis is a disease in which the bones lose minerals and strength with aging. This can result in bone fractures. If you are 17 years old or older, or if you are at risk for osteoporosis and fractures, ask your health care provider if you should: Be screened for bone loss. Take a calcium or vitamin D supplement to lower your risk of fractures. Be given hormone replacement therapy (HRT) to treat symptoms of menopause. Follow these instructions at home: Lifestyle Do not use any products that contain nicotine or tobacco, such as cigarettes, e-cigarettes, and chewing tobacco. If you need help quitting, ask your health care provider. Do not use street drugs. Do not share needles. Ask your health care provider for help if you need support or information about quitting drugs. Alcohol use Do not drink alcohol if: Your health care provider tells you not to drink. You are pregnant, may be pregnant, or are planning to become pregnant. If you drink alcohol: Limit how much you use to 0-1 drink a day. Limit intake if you are breastfeeding. Be aware of how much alcohol is in your drink. In the [place] one drink equals one 12 oz bottle of beer (355 mL), one 5 oz glass of wine (148 mL), or one 1 oz glass of hard liquor (44 mL). General instructions Schedule regular health, dental, and eye exams. Stay current with your vaccines. Tell your health care provider if: You often feel depressed. You have ever been abused or do not feel safe at home. Summary Adopting a healthy lifestyle and getting preventive care are important in promoting health and  wellness. Follow your health care provider's instructions about healthy diet, exercising, and getting tested or screened for diseases. Follow your health care provider's instructions on monitoring your cholesterol and blood pressure. This information is not intended to replace advice given to you by your health care provider. Make sure you discuss any questions you have with your healthcare provider. Document Revised: 02/17/2018 Document Reviewed: 02/17/2018 Elsevier Patient Education  2022 Elsevier Inc.  American Heart Association [facility]) Exercise Recommendation  Being physically active is important to prevent heart disease and stroke, the nation's No. 1and No. 5killers. To improve overall cardiovascular health, we suggest at least 150 minutes per week of moderate exercise or 75 minutes per week of vigorous exercise (or a combination of moderate and vigorous activity). Thirty minutes a day, five times a week is an easy goal to remember. You will also experience benefits even if you divide your time into two or three segments of 10 to 15 minutes per day.  For people who would benefit from lowering their blood pressure or cholesterol, we recommend 40 minutes of aerobic exercise of moderate to vigorous intensity three to four times a week to lower the risk for  heart attack and stroke.  Physical activity is anything that makes you move your body and burn calories.  This includes things like climbing stairs or playing sports. Aerobic exercises benefit your heart, and include walking, jogging, swimming or biking. Strength and stretching exercises are best for overall stamina and flexibility.  The simplest, positive change you can make to effectively improve your heart health is to start walking. It's enjoyable, free, easy, social and great exercise. A walking program is flexible and boasts high success rates because people can stick with it. It's easy for walking to become a regular and satisfying part of  life.   For Overall Cardiovascular Health: At least 30 minutes of moderate-intensity aerobic activity at least 5 days per week for a total of 150  OR  At least 25 minutes of vigorous aerobic activity at least 3 days per week for a total of 75 minutes; or a combination of moderate- and vigorous-intensity aerobic activity  AND  Moderate- to high-intensity muscle-strengthening activity at least 2 days per week for additional health benefits.  For Lowering Blood Pressure and Cholesterol An average 40 minutes of moderate- to vigorous-intensity aerobic activity 3 or 4 times per week  What if I can't make it to the time goal? Something is always better than nothing! And everyone has to start somewhere. Even if you've been sedentary for years, today is the day you can begin to make healthy changes in your life. If you don't think you'll make it for 30 or 40 minutes, set a reachable goal for today. You can work up toward your overall goal by increasing your time as you get stronger. Don't let all-or-nothing thinking rob you of doing what you can every day.  Source:http://www.heart.org

## 2020-09-28 LAB — HEPATITIS C ANTIBODY
Hepatitis C Ab: NONREACTIVE
SIGNAL TO CUT-OFF: 0.01 (ref <1.00)

## 2020-09-28 LAB — HIV ANTIBODY (ROUTINE TESTING W REFLEX): HIV 1&2 Ab, 4th Generation: NONREACTIVE

## 2020-10-08 ENCOUNTER — Other Ambulatory Visit: Payer: BC Managed Care – PPO

## 2020-10-15 ENCOUNTER — Ambulatory Visit: Payer: Medicaid Other | Admitting: Physician Assistant

## 2020-10-22 ENCOUNTER — Other Ambulatory Visit: Payer: Self-pay

## 2020-10-22 ENCOUNTER — Ambulatory Visit (INDEPENDENT_AMBULATORY_CARE_PROVIDER_SITE_OTHER): Payer: BC Managed Care – PPO | Admitting: Physician Assistant

## 2020-10-22 ENCOUNTER — Encounter: Payer: Self-pay | Admitting: Physician Assistant

## 2020-10-22 ENCOUNTER — Other Ambulatory Visit (HOSPITAL_COMMUNITY)
Admission: RE | Admit: 2020-10-22 | Discharge: 2020-10-22 | Disposition: A | Discharge status: Home / Self Care | Payer: BC Managed Care – PPO | Source: Ambulatory Visit | Attending: Physician Assistant | Admitting: Physician Assistant

## 2020-10-22 VITALS — BP 120/70 | HR 64 | Temp 98.2°F | Ht 63.0 in | Wt 200.0 lb

## 2020-10-22 DIAGNOSIS — Z124 Encounter for screening for malignant neoplasm of cervix: Secondary | ICD-10-CM | POA: Insufficient documentation

## 2020-10-22 DIAGNOSIS — Z113 Encounter for screening for infections with a predominantly sexual mode of transmission: Secondary | ICD-10-CM | POA: Diagnosis present

## 2020-10-22 DIAGNOSIS — N926 Irregular menstruation, unspecified: Secondary | ICD-10-CM | POA: Diagnosis not present

## 2020-10-22 NOTE — Progress Notes (Signed)
Mary [name] is a 30 y.o. female is here for pap smear.  I acted as a Neurosurgeon for [employer], PA-C [employer], LPN   History of Present Illness:   Chief Complaint  Patient presents with   Pap smear only     HPI  Pap smear Pt is here for pap smear only today. Last pap 2017, normal. She does note that she has had had some increased irregularity in her periods. Denies increased pain. Would like STD testing today.  Health Maintenance Due  Topic Date Due   PAP SMEAR-Modifier  10/03/2018   COVID-19 Vaccine (3 - Booster for Pfizer series) 02/28/2020    Past Medical History:  Diagnosis Date   Gestational diabetes    during both pregnancies   Pregnancy induced hypertension 06-04-11   gestational hypertention   Thyroid enlarged      Social History   Tobacco Use   Smoking status: Never   Smokeless tobacco: Never  Vaping Use   Vaping Use: Never used  Substance Use Topics   Alcohol use: No   Drug use: No    Past Surgical History:  Procedure Laterality Date   NO PAST SURGERIES     VAGINAL DELIVERY  2013 2014    Family History  Problem Relation Age of Onset   Diabetes Paternal Grandfather    Hypertension Paternal Grandfather    Lupus Paternal Aunt    Lupus Paternal Grandmother    Cancer Paternal Grandmother        breast or gyn cancer   Hypertension Mother    Colon cancer Neg Hx     PMHx, SurgHx, SocialHx, FamHx, Medications, and Allergies were reviewed in the Visit Navigator and updated as appropriate.   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    Social History   Tobacco Use   Smoking status: Never   Smokeless tobacco: Never  Vaping Use   Vaping Use: Never used  Substance Use Topics   Alcohol use: No   Drug use: No    Current Medications and Allergies:     Current Outpatient Medications:    BIOTIN PO, Take 2 each by mouth daily., Disp: , Rfl:    ibuprofen (ADVIL,MOTRIN) 600 MG tablet, Take 1 tablet (600 mg total) by mouth every 6 (six) hours., Disp: 30 tablet, Rfl: 0   Multiple Vitamin (MULTIVITAMIN) tablet, Take 1 tablet by mouth daily., Disp: , Rfl:    Allergies  Allergen Reactions   Procardia [Nifedipine] Nausea And Vomiting    Can take with zofran per patient    Review of Systems   ROS Negative unless otherwise specified per HPI.  Vitals:   Vitals:   10/22/20 1020  BP: 120/70  Pulse: 64  Temp: 98.2 F (36.8 C)  TempSrc: Temporal  SpO2: 97%  Weight: 200 lb (90.7 kg)  Height: 5\' 3"  (1.6 m)     Body mass index is 35.43 kg/m.   Physical Exam:    Physical Exam Exam conducted with a chaperone present.  Constitutional:      Appearance: Normal appearance. She is well-developed.  HENT:     Head: Normocephalic and atraumatic.  Eyes:     General: Lids are normal.     Extraocular Movements: Extraocular movements intact.     Conjunctiva/sclera: Conjunctivae normal.  Pulmonary:  Effort: Pulmonary effort is normal.  Genitourinary:    Vagina: Normal.     Cervix: Cervical bleeding present.  Musculoskeletal:        General: Normal range of motion.     Cervical back: Normal range of motion and neck supple.  Skin:    General: Skin is warm and dry.  Neurological:     Mental Status: She is alert and oriented to person, place, and time.  Psychiatric:        Attention and Perception: Attention and perception normal.        Mood and Affect: Mood normal.        Behavior: Behavior normal.        Thought Content: Thought content normal.        Judgment: Judgment normal.     Assessment and Plan:    Mary [name] was seen today for pap smear only.  Diagnoses and all orders for this visit:  Screening for cervical cancer; Screening examination for STD (sexually transmitted disease) Updated today. Avoid sex until  results have returned. -     Cytology - PAP -     Cervicovaginal ancillary only( [facility])  Irregular bleeding She will continue to monitor. Denies any intervention at this time. Recommend that she track periods in app.  CMA or LPN served as scribe during this visit. History, Physical, and Plan performed by medical provider. The above documentation has been reviewed and is accurate and complete.   Jarold Motto, PA-C [place] [place] 10/22/2020  Follow-up: No follow-ups on file.

## 2020-10-22 NOTE — Patient Instructions (Signed)
It was great to see you!  We will contact you with your PAP smear results.  Take care,  Jarold Motto PA-C

## 2020-10-24 LAB — CERVICOVAGINAL ANCILLARY ONLY
Chlamydia: NEGATIVE
Comment: NEGATIVE
Comment: NEGATIVE
Comment: NORMAL
Neisseria Gonorrhea: NEGATIVE
Trichomonas: NEGATIVE

## 2020-10-26 LAB — CYTOLOGY - PAP
Comment: NEGATIVE
Diagnosis: NEGATIVE
High risk HPV: NEGATIVE

## 2021-01-18 ENCOUNTER — Encounter: Payer: Self-pay | Admitting: Physician Assistant

## 2021-01-18 ENCOUNTER — Telehealth (INDEPENDENT_AMBULATORY_CARE_PROVIDER_SITE_OTHER): Payer: BC Managed Care – PPO | Admitting: Physician Assistant

## 2021-01-18 VITALS — Ht 63.0 in | Wt 185.0 lb

## 2021-01-18 DIAGNOSIS — J069 Acute upper respiratory infection, unspecified: Secondary | ICD-10-CM

## 2021-01-18 NOTE — Progress Notes (Signed)
Virtual Visit via Video   I connected with Mary [name] on 01/18/21 at  9:00 AM EST by a video enabled telemedicine application and verified that I am speaking with the correct person using two identifiers. Location patient: Home Location provider: [facility], Office Persons participating in the virtual visit: [place], Barbe PA-C, Corky Mull, LPN   I discussed the limitations of evaluation and management by telemedicine and the availability of in person appointments. The patient expressed understanding and agreed to proceed.  I acted as a Neurosurgeon for [employer], PA-C [employer], LPN  Subjective:   HPI:   Cough Pt c/o dry non-productive cough, chest congestion, headache and body aches started on Tuesday. Pt said she was very nauseous this past weekend, but has resolved. Denies fever or chills. She has not been taking any medications.  Son had some congestion last week and cough. Denied son having fever.  Son has improved.  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 She needs a work note so she does not participate in church activities this weekend.  Patient's last menstrual period was 01/12/2021.   ROS: See pertinent positives and negatives per HPI.  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 GENERAL: Alert, appears  well and in no acute distress. HEENT: Atraumatic, conjunctiva clear, no obvious abnormalities on inspection of external nose and ears. NECK: Normal movements of the head and neck. CARDIOPULMONARY: No increased WOB. Speaking in clear sentences. I:E ratio WNL.  MS: Moves all visible extremities without noticeable abnormality. PSYCH: Pleasant and cooperative, well-groomed. Speech normal rate and rhythm. Affect is appropriate. Insight and judgement are appropriate. Attention is focused, linear, and appropriate.  NEURO: CN grossly intact. Oriented as arrived to appointment on time with no prompting. Moves both UE equally.  SKIN: No obvious lesions, wounds, erythema, or cyanosis noted on face or hands.  Assessment and Plan:   [place] was seen today for cough.  Diagnoses and all orders for this visit:  Upper respiratory tract infection, unspecified type No red flags on discussion. Clinically improving and is non-toxic on discussion. Discussed trialing OTC cough/cold medications such as mucinex for her symptoms. Declined testing for flu and COVID -- I have low suspicion for this. Reviewed return precautions including fever, SOB, worsening cough or other concerns. Push fluids and rest. I recommend that patient follow-up if symptoms worsen or persist despite treatment x 7-10 days, sooner if needed.   I discussed the assessment and treatment plan with the patient. The patient was provided an opportunity to ask questions and all were answered. The patient agreed with the plan and demonstrated an understanding of the instructions.   The patient was advised to call back or seek an in-person evaluation if the symptoms worsen or if the condition fails to improve as anticipated.  CMA or LPN served as scribe during this visit. History, Physical, and Plan performed by medical provider. The above documentation has been reviewed and is accurate and complete.   [place], [place] 01/18/2021

## 2021-10-02 ENCOUNTER — Encounter: Payer: BC Managed Care – PPO | Admitting: Physician Assistant

## 2021-10-02 NOTE — Progress Notes (Incomplete)
SCRIBE STATEMENT  Subjective:    Mary [name] is a 31 y.o. female and is here for a comprehensive physical exam.  HPI  Health Maintenance Due  Topic Date Due   COVID-19 Vaccine (3 - Pfizer series) 11/23/2019   TETANUS/TDAP  05/20/2021    Acute Concerns:   Chronic Issues:   Health Maintenance: Immunizations -- *** PAP -- *** Bone Density -- N/A Diet -- *** Sleep habits -- *** Exercise -- *** Current Weight -- @FLOWAMB (14)@  Weight History: Wt Readings from Last 10 Encounters:  01/18/21 185 lb (83.9 kg)  10/22/20 200 lb (90.7 kg)  09/27/20 206 lb 3.2 oz (93.5 kg)  05/11/20 190 lb (86.2 kg)  09/26/19 211 lb (95.7 kg)  08/12/19 208 lb 6.1 oz (94.5 kg)  05/07/18 200 lb (90.7 kg)  12/07/16 197 lb 12.8 oz (89.7 kg)  10/03/15 201 lb (91.2 kg)  08/30/12 211 lb (95.7 kg)   There is no height or weight on file to calculate BMI. Mood -- ***  No LMP recorded. Period characteristics -- *** Birth control -- ***    reports no history of alcohol use.  Tobacco Use: Low Risk  (01/18/2021)   Patient History    Smoking Tobacco Use: Never    Smokeless Tobacco Use: Never    Passive Exposure: Not on file        09/27/2020   12:59 PM  Depression screen PHQ 2/9  Decreased Interest 0  Down, Depressed, Hopeless 0  PHQ - 2 Score 0     Other providers/specialists: Patient Care Team: 4/9, Jarold Motto as PCP - General (Physician Assistant)   PMHx, SurgHx, SocialHx, Medications, and Allergies were reviewed in the Visit Navigator and updated as appropriate.   Past Medical History:  Diagnosis Date   Gestational diabetes    during both pregnancies   Pregnancy induced hypertension 06-04-11   gestational hypertention   Thyroid enlarged      Past Surgical History:  Procedure Laterality Date   NO PAST SURGERIES     VAGINAL DELIVERY  2013 2014     Family History  Problem Relation Age of Onset   Diabetes Paternal Grandfather    Hypertension Paternal  Grandfather    Lupus Paternal Aunt    Lupus Paternal Grandmother    Cancer Paternal Grandmother        breast or gyn cancer   Hypertension Mother    Colon cancer Neg Hx     Social History   Tobacco Use   Smoking status: Never   Smokeless tobacco: Never  Vaping Use   Vaping Use: Never used  Substance Use Topics   Alcohol use: No   Drug use: No    Review of Systems:   ROS Negative unless otherwise specified per HPI.   Objective:   There were no vitals taken for this visit.  General Appearance:    Alert, cooperative, no distress, appears stated age  Head:    Normocephalic, without obvious abnormality, atraumatic  Eyes:    PERRL, conjunctiva/corneas clear, EOM's intact, fundi    benign, both eyes  Ears:    Normal TM's and external ear canals, both ears  Nose:   Nares normal, septum midline, mucosa normal, no drainage    or sinus tenderness  Throat:   Lips, mucosa, and tongue normal; teeth and gums normal  Neck:   Supple, symmetrical, trachea midline, no adenopathy;    thyroid:  no enlargement/tenderness/nodules; no carotid   bruit or JVD  Back:     Symmetric, no curvature, ROM normal, no CVA tenderness  Lungs:     Clear to auscultation bilaterally, respirations unlabored  Chest Wall:    No tenderness or deformity   Heart:    Regular rate and rhythm, S1 and S2 normal, no murmur, rub   or gallop  Breast Exam:    No tenderness, masses, or nipple abnormality  Abdomen:     Soft, non-tender, bowel sounds active all four quadrants,    no masses, no organomegaly  Genitalia:    Normal female without lesion, discharge or tenderness  Rectal:    Normal tone no masses or tenderness  Extremities:   Extremities normal, atraumatic, no cyanosis or edema  Pulses:   2+ and symmetric all extremities  Skin:   Skin color, texture, turgor normal, no rashes or lesions  Lymph nodes:   Cervical, supraclavicular, and axillary nodes normal  Neurologic:   CNII-XII intact, normal strength,  sensation and reflexes    throughout    Assessment/Plan:   @DIAGLIST @    Patient Counseling:   [x]     Nutrition: Stressed importance of moderation in sodium/caffeine intake, saturated fat and cholesterol, caloric balance, sufficient intake of fresh fruits, vegetables, fiber, calcium, iron, and 1 mg of folate supplement per day (for females capable of pregnancy).   [x]      Stressed the importance of regular exercise.    [x]     Substance Abuse: Discussed cessation/primary prevention of tobacco, alcohol, or other drug use; driving or other dangerous activities under the influence; availability of treatment for abuse.    [x]      Injury prevention: Discussed safety belts, safety helmets, smoke detector, smoking near bedding or upholstery.    [x]      Sexuality: Discussed sexually transmitted diseases, partner selection, use of condoms, avoidance of unintended pregnancy  and contraceptive alternatives.    [x]     Dental health: Discussed importance of regular tooth brushing, flossing, and dental visits.   [x]      Health maintenance and immunizations reviewed. Please refer to Health maintenance section.    I,Savera Zaman,acting as a for , PA.,have documented all relevant documentation on the behalf of , PA,as directed by  , PA while in the presence of , .   ***  , PA-C [place] [facility]

## 2021-10-04 ENCOUNTER — Ambulatory Visit: Payer: BC Managed Care – PPO | Admitting: Physician Assistant

## 2021-10-08 ENCOUNTER — Encounter: Payer: Self-pay | Admitting: Physician Assistant

## 2021-10-08 ENCOUNTER — Other Ambulatory Visit (HOSPITAL_COMMUNITY)
Admission: RE | Admit: 2021-10-08 | Discharge: 2021-10-08 | Disposition: A | Discharge status: Home / Self Care | Payer: BC Managed Care – PPO | Source: Ambulatory Visit | Attending: Physician Assistant | Admitting: Physician Assistant

## 2021-10-08 ENCOUNTER — Ambulatory Visit (INDEPENDENT_AMBULATORY_CARE_PROVIDER_SITE_OTHER): Payer: BC Managed Care – PPO | Admitting: Physician Assistant

## 2021-10-08 VITALS — BP 110/70 | HR 61 | Temp 97.8°F | Ht 64.0 in | Wt 199.0 lb

## 2021-10-08 DIAGNOSIS — Z113 Encounter for screening for infections with a predominantly sexual mode of transmission: Secondary | ICD-10-CM

## 2021-10-08 DIAGNOSIS — E010 Iodine-deficiency related diffuse (endemic) goiter: Secondary | ICD-10-CM

## 2021-10-08 DIAGNOSIS — Z124 Encounter for screening for malignant neoplasm of cervix: Secondary | ICD-10-CM | POA: Insufficient documentation

## 2021-10-08 DIAGNOSIS — Z0001 Encounter for general adult medical examination with abnormal findings: Secondary | ICD-10-CM

## 2021-10-08 DIAGNOSIS — Z23 Encounter for immunization: Secondary | ICD-10-CM | POA: Diagnosis not present

## 2021-10-08 DIAGNOSIS — J383 Other diseases of vocal cords: Secondary | ICD-10-CM

## 2021-10-08 DIAGNOSIS — R739 Hyperglycemia, unspecified: Secondary | ICD-10-CM

## 2021-10-08 DIAGNOSIS — E669 Obesity, unspecified: Secondary | ICD-10-CM

## 2021-10-08 NOTE — Patient Instructions (Signed)
It was great to see you! ? ?Please go to the lab for blood work.  ? ?Our office will call you with your results unless you have chosen to receive results via MyChart. ? ?If your blood work is normal we will follow-up each year for physicals and as scheduled for chronic medical problems. ? ?If anything is abnormal we will treat accordingly and get you in for a follow-up. ? ?Take care, ? ?Mary [name] ?  ? ? ?

## 2021-10-08 NOTE — Progress Notes (Signed)
Subjective:    Mary [name] is a 31 y.o. female and is here for a comprehensive physical exam.  HPI  There are no preventive care reminders to display for this patient.   Acute Concerns: Throat pain- She complains of pins and cutting sensations while singing in her throat. She notes it feels like swallowing glass when her symptoms worsen. Her pains resolves after she swallows 2-3 times. She is drinking plenty of water. She denies having any unexpected throat bleeding, unexpected weight changes, changes in her voice.  She denies having any weakness or changes to her legs, swelling in her stomach.   Chronic Issues: N/A  Health Maintenance: Family history -- She reports no new changes to her family medical history.  She reports her father has no changes to his medical history. Her maternal grandmother has a history of breast cancer. She has no known family medical history of colon cancer.  Alcohol -- She does not drink alcohol. Immunizations -- UTD PAP -- Last completed 10/22/2020. Results showed STD panel was negative. PAP showed scant cells, but what they could see was normal. We updated this today. Bone Density -- N/A Diet -- She is managing a healthy diet at this time.  Sleep habits -- N/A Exercise -- She is participating in regular exercise by walking 4-6 miles daily. She reports not exercising regularly for the past 3 weeks but is planning on resuming. She reports prior to her break she weighed around 175-180 lb's.  Current Weight -- 185 lb's   Weight History: Wt Readings from Last 10 Encounters:  10/08/21 199 lb (90.3 kg)  01/18/21 185 lb (83.9 kg)  10/22/20 200 lb (90.7 kg)  09/27/20 206 lb 3.2 oz (93.5 kg)  05/11/20 190 lb (86.2 kg)  09/26/19 211 lb (95.7 kg)  08/12/19 208 lb 6.1 oz (94.5 kg)  05/07/18 200 lb (90.7 kg)  12/07/16 197 lb 12.8 oz (89.7 kg)  10/03/15 201 lb (91.2 kg)   Body mass index is 34.16 kg/m. Mood -- She reports her mood is doing well. She  prioritizes her mental health.   Patient's last menstrual period was 09/28/2021 (exact date). Period characteristics -- Regular Birth control -- N/A. Dental -- She is UTD on dental care.  Vision -- She is UTD on vision care.     reports no history of alcohol use.  Tobacco Use: Low Risk  (10/08/2021)   Patient History    Smoking Tobacco Use: Never    Smokeless Tobacco Use: Never    Passive Exposure: Not on file        10/08/2021    2:11 PM  Depression screen PHQ 2/9  Decreased Interest 0  Down, Depressed, Hopeless 0  PHQ - 2 Score 0     Other providers/specialists: Patient Care Team: Jarold Motto, [place] as PCP - General (Physician Assistant)   PMHx, SurgHx, SocialHx, Medications, and Allergies were reviewed in the Visit Navigator and updated as appropriate.   Past Medical History:  Diagnosis Date   Gestational diabetes    during both pregnancies   Pregnancy induced hypertension 06-04-11   gestational hypertention   Thyroid enlarged      Past Surgical History:  Procedure Laterality Date   NO PAST SURGERIES     VAGINAL DELIVERY  2013 2014     Family History  Problem Relation Age of Onset   Hypertension Mother    Cancer Paternal Grandmother        breast or gyn cancer  Congestive Heart Failure Paternal Grandmother    Diabetes Paternal Grandfather    Hypertension Paternal Grandfather    Lupus Paternal Aunt    Colon cancer Neg Hx     Social History   Tobacco Use   Smoking status: Never   Smokeless tobacco: Never  Vaping Use   Vaping Use: Never used  Substance Use Topics   Alcohol use: No   Drug use: No    Review of Systems:   Review of Systems  Constitutional:  Negative for chills, fever, malaise/fatigue and weight loss.       (-)unexpected weight change  HENT:  Negative for hearing loss, sinus pain and sore throat.        (+)throat pain after singing (-)unexpected bleeding in throat   Respiratory:  Negative for cough and hemoptysis.    Cardiovascular:  Negative for chest pain, palpitations, leg swelling and PND.  Gastrointestinal:  Negative for abdominal pain, constipation, diarrhea, heartburn, nausea and vomiting.       (-)swelling in abdomen  Genitourinary:  Negative for dysuria, frequency and urgency.  Musculoskeletal:  Negative for back pain, myalgias and neck pain.  Skin:  Negative for itching and rash.  Neurological:  Negative for dizziness, tingling, seizures, weakness and headaches.  Endo/Heme/Allergies:  Negative for polydipsia.  Psychiatric/Behavioral:  Negative for depression. The patient is not nervous/anxious.     Objective:   BP 110/70 (BP Location: Left Arm, Patient Position: Sitting, Cuff Size: Large)   Pulse 61   Temp 97.8 F (36.6 C) (Temporal)   Ht 5\' 4"  (1.626 m)   Wt 199 lb (90.3 kg)   LMP 09/28/2021 (Exact Date)   SpO2 99%   BMI 34.16 kg/m   General Appearance:    Alert, cooperative, no distress, appears stated age  Head:    Normocephalic, without obvious abnormality, atraumatic  Eyes:    PERRL, conjunctiva/corneas clear, EOM's intact, fundi    benign, both eyes  Ears:    Normal TM's and external ear canals, both ears  Nose:   Nares normal, septum midline, mucosa normal, no drainage    or sinus tenderness  Throat:   Lips, mucosa, and tongue normal; teeth and gums normal  Neck:   Supple, symmetrical, trachea midline, no adenopathy;    thyroid:  no enlargement/tenderness/nodules; no carotid   bruit or JVD  Back:     Symmetric, no curvature, ROM normal, no CVA tenderness  Lungs:     Clear to auscultation bilaterally, respirations unlabored  Chest Wall:    No tenderness or deformity   Heart:    Regular rate and rhythm, S1 and S2 normal, no murmur, rub   or gallop  Breast Exam:    Deferred  Abdomen:     Soft, non-tender, bowel sounds active all four quadrants,    no masses, no organomegaly  Genitalia:    Normal female without lesion, discharge or tenderness  Rectal:    Normal tone no  masses or tenderness  Extremities:   Extremities normal, atraumatic, no cyanosis or edema  Pulses:   2+ and symmetric all extremities  Skin:   Skin color, texture, turgor normal, no rashes or lesions  Lymph nodes:   Cervical, supraclavicular, and axillary nodes normal  Neurologic:   CNII-XII intact, normal strength, sensation and reflexes    throughout    Assessment/Plan:   Encounter for general adult medical examination with abnormal findings Today patient counseled on age appropriate routine health concerns for screening and prevention, each reviewed and  up to date or declined. Immunizations reviewed and up to date or declined. Labs ordered and reviewed. Risk factors for depression reviewed and negative. Hearing function and visual acuity are intact. ADLs screened and addressed as needed. Functional ability and level of safety reviewed and appropriate. Education, counseling and referrals performed based on assessed risks today. Patient provided with a copy of personalized plan for preventive services.  Vocal cord strain Referral to [facility] for evaluation.  Pap smear for cervical cancer screening Completed today  Thyromegaly Update TSH per patient request  Hyperglycemia Update A1c; continue healthy lifestyle efforts  Obesity, unspecified classification, unspecified obesity type, unspecified whether serious comorbidity present Continue to work on diet and exercise  Screening examination for STD (sexually transmitted disease) Update panel today  Need for prophylactic vaccination with combined diphtheria-tetanus-pertussis (DTP) vaccine Completed todayk   Patient Counseling:   [x]     Nutrition: Stressed importance of moderation in sodium/caffeine intake, saturated fat and cholesterol, caloric balance, sufficient intake of fresh fruits, vegetables, fiber, calcium, iron, and 1 mg of folate supplement per day (for females capable of pregnancy).   [x]      Stressed the importance  of regular exercise.    [x]     Substance Abuse: Discussed cessation/primary prevention of tobacco, alcohol, or other drug use; driving or other dangerous activities under the influence; availability of treatment for abuse.    [x]      Injury prevention: Discussed safety belts, safety helmets, smoke detector, smoking near bedding or upholstery.    [x]      Sexuality: Discussed sexually transmitted diseases, partner selection, use of condoms, avoidance of unintended pregnancy  and contraceptive alternatives.    [x]     Dental health: Discussed importance of regular tooth brushing, flossing, and dental visits.   [x]      Health maintenance and immunizations reviewed. Please refer to Health maintenance section.    I,Savera Zaman,acting as a for , PA.,have documented all relevant documentation on the behalf of , PA,as directed by  , PA while in the presence of , .   I, , Neurosurgeon, have reviewed all documentation for this visit. The documentation on 10/08/21 for the exam, diagnosis, procedures, and orders are all accurate and complete.   Jarold Motto, PA-C [facility]

## 2021-10-10 LAB — CERVICOVAGINAL ANCILLARY ONLY
Bacterial Vaginitis (gardnerella): NEGATIVE
Candida Glabrata: NEGATIVE
Candida Vaginitis: NEGATIVE
Chlamydia: NEGATIVE
Comment: NEGATIVE
Comment: NEGATIVE
Comment: NEGATIVE
Comment: NEGATIVE
Comment: NEGATIVE
Comment: NORMAL
Neisseria Gonorrhea: NEGATIVE
Trichomonas: NEGATIVE

## 2021-10-11 LAB — CYTOLOGY - PAP
Comment: NEGATIVE
Diagnosis: NEGATIVE
High risk HPV: NEGATIVE

## 2021-10-23 ENCOUNTER — Other Ambulatory Visit: Payer: Self-pay | Admitting: *Deleted

## 2021-10-23 ENCOUNTER — Other Ambulatory Visit (INDEPENDENT_AMBULATORY_CARE_PROVIDER_SITE_OTHER): Payer: BC Managed Care – PPO

## 2021-10-23 DIAGNOSIS — E669 Obesity, unspecified: Secondary | ICD-10-CM | POA: Diagnosis not present

## 2021-10-23 DIAGNOSIS — R739 Hyperglycemia, unspecified: Secondary | ICD-10-CM | POA: Diagnosis not present

## 2021-10-24 LAB — COMPREHENSIVE METABOLIC PANEL
ALT: 10 U/L (ref 0–35)
AST: 12 U/L (ref 0–37)
Albumin: 4.1 g/dL (ref 3.5–5.2)
Alkaline Phosphatase: 49 U/L (ref 39–117)
BUN: 11 mg/dL (ref 6–23)
CO2: 23 mEq/L (ref 19–32)
Calcium: 8.8 mg/dL (ref 8.4–10.5)
Chloride: 107 mEq/L (ref 96–112)
Creatinine, Ser: 0.86 mg/dL (ref 0.40–1.20)
GFR: 89.27 mL/min (ref >60.00)
Glucose, Bld: 76 mg/dL (ref 70–99)
Potassium: 4.3 mEq/L (ref 3.5–5.1)
Sodium: 138 mEq/L (ref 135–145)
Total Bilirubin: 0.6 mg/dL (ref 0.2–1.2)
Total Protein: 7 g/dL (ref 6.0–8.3)

## 2021-10-24 LAB — CBC WITH DIFFERENTIAL/PLATELET
Basophils Absolute: 0 10*3/uL (ref 0.0–0.1)
Basophils Relative: 1.1 % (ref 0.0–3.0)
Eosinophils Absolute: 0.1 10*3/uL (ref 0.0–0.7)
Eosinophils Relative: 2.2 % (ref 0.0–5.0)
HCT: 35.5 % — ABNORMAL LOW (ref 36.0–46.0)
Hemoglobin: 11.9 g/dL — ABNORMAL LOW (ref 12.0–15.0)
Lymphocytes Relative: 42.3 % (ref 12.0–46.0)
Lymphs Abs: 1.9 10*3/uL (ref 0.7–4.0)
MCHC: 33.6 g/dL (ref 30.0–36.0)
MCV: 92.7 fl (ref 78.0–100.0)
Monocytes Absolute: 0.2 10*3/uL (ref 0.1–1.0)
Monocytes Relative: 5.6 % (ref 3.0–12.0)
Neutro Abs: 2.2 10*3/uL (ref 1.4–7.7)
Neutrophils Relative %: 48.8 % (ref 43.0–77.0)
Platelets: 273 10*3/uL (ref 150.0–400.0)
RBC: 3.83 Mil/uL — ABNORMAL LOW (ref 3.87–5.11)
RDW: 15.1 % (ref 11.5–15.5)
WBC: 4.5 10*3/uL (ref 4.0–10.5)

## 2021-10-24 LAB — LIPID PANEL
Cholesterol: 197 mg/dL (ref 0–200)
HDL: 43.8 mg/dL (ref >39.00)
LDL Cholesterol: 136 mg/dL — ABNORMAL HIGH (ref 0–99)
NonHDL: 152.95
Total CHOL/HDL Ratio: 4
Triglycerides: 87 mg/dL (ref 0.0–149.0)
VLDL: 17.4 mg/dL (ref 0.0–40.0)

## 2021-10-24 LAB — TSH: TSH: 2.38 u[IU]/mL (ref 0.35–5.50)

## 2021-10-24 LAB — HEMOGLOBIN A1C: Hgb A1c MFr Bld: 6.1 % (ref 4.6–6.5)

## 2022-10-08 ENCOUNTER — Encounter (INDEPENDENT_AMBULATORY_CARE_PROVIDER_SITE_OTHER): Payer: Self-pay

## 2022-10-22 NOTE — Progress Notes (Shared)
Subjective:    Mary [name] is a 32 y.o. female and is here for a comprehensive physical exam.  HPI  There are no preventive care reminders to display for this patient.  Acute Concerns: ***  Chronic Issues:   History of gestational diabetes, pregnancy induced hypertension, enlarged thyroid. Takes a multivitamin.  Health Maintenance: Immunizations -- UTD on tetanus vaccine. Colonoscopy -- N/A Mammogram -- N/A PAP -- Last completed 10/08/21. Negative for intraepithelial lesion or malignancy. Recommended repeat in 2026. Bone Density -- N/A Diet -- *** Exercise -- ***  Sleep habits -- *** Mood -- ***  UTD with dentist? - *** UTD with eye doctor? - ***  Weight history: Wt Readings from Last 10 Encounters:  10/08/21 199 lb (90.3 kg)  01/18/21 185 lb (83.9 kg)  10/22/20 200 lb (90.7 kg)  09/27/20 206 lb 3.2 oz (93.5 kg)  05/11/20 190 lb (86.2 kg)  09/26/19 211 lb (95.7 kg)  08/12/19 208 lb 6.1 oz (94.5 kg)  05/07/18 200 lb (90.7 kg)  12/07/16 197 lb 12.8 oz (89.7 kg)  10/03/15 201 lb (91.2 kg)   There is no height or weight on file to calculate BMI. No LMP recorded.  Alcohol use:  reports no history of alcohol use.  Tobacco use:  Tobacco Use: Low Risk  (10/08/2021)   Patient History    Smoking Tobacco Use: Never    Smokeless Tobacco Use: Never    Passive Exposure: Not on file   Eligible for lung cancer screening? No     10/08/2021    2:11 PM  Depression screen PHQ 2/9  Decreased Interest 0  Down, Depressed, Hopeless 0  PHQ - 2 Score 0     Other providers/specialists: Patient Care Team: Jarold Motto, [place] as PCP - General (Physician Assistant)    PMHx, SurgHx, SocialHx, Medications, and Allergies were reviewed in the Visit Navigator and updated as appropriate.   Past Medical History:  Diagnosis Date   Gestational diabetes    during both pregnancies   Pregnancy induced hypertension 06-04-11   gestational hypertention   Thyroid enlarged       Past Surgical History:  Procedure Laterality Date   NO PAST SURGERIES     VAGINAL DELIVERY  2013 2014     Family History  Problem Relation Age of Onset   Hypertension Mother    Cancer Paternal Grandmother        breast or gyn cancer   Congestive Heart Failure Paternal Grandmother    Diabetes Paternal Grandfather    Hypertension Paternal Grandfather    Lupus Paternal Aunt    Colon cancer Neg Hx     Social History   Tobacco Use   Smoking status: Never   Smokeless tobacco: Never  Vaping Use   Vaping status: Never Used  Substance Use Topics   Alcohol use: No   Drug use: No    Review of Systems:   ROS  Objective:   There were no vitals taken for this visit. There is no height or weight on file to calculate BMI.   General Appearance:    Alert, cooperative, no distress, appears stated age  Head:    Normocephalic, without obvious abnormality, atraumatic  Eyes:    PERRL, conjunctiva/corneas clear, EOM's intact, fundi    benign, both eyes  Ears:    Normal TM's and external ear canals, both ears  Nose:   Nares normal, septum midline, mucosa normal, no drainage    or sinus tenderness  Throat:   Lips, mucosa, and tongue normal; teeth and gums normal  Neck:   Supple, symmetrical, trachea midline, no adenopathy;    thyroid:  no enlargement/tenderness/nodules; no carotid   bruit or JVD  Back:     Symmetric, no curvature, ROM normal, no CVA tenderness  Lungs:     Clear to auscultation bilaterally, respirations unlabored  Chest Wall:    No tenderness or deformity   Heart:    Regular rate and rhythm, S1 and S2 normal, no murmur, rub or gallop  Breast Exam:    ***No tenderness, masses, or nipple abnormality  Abdomen:     Soft, non-tender, bowel sounds active all four quadrants,    no masses, no organomegaly  Genitalia:    ***Normal female without lesion, discharge or tenderness  Extremities:   Extremities normal, atraumatic, no cyanosis or edema  Pulses:   2+ and  symmetric all extremities  Skin:   Skin color, texture, turgor normal, no rashes or lesions  Lymph nodes:   Cervical, supraclavicular, and axillary nodes normal  Neurologic:   CNII-XII intact, normal strength, sensation and reflexes    throughout    Assessment/Plan:   ***   I,Alexander Ruley,acting as a scribe for [employer], PA.,have documented all relevant documentation on the behalf of Jarold Motto, PA,as directed by  Jarold Motto, PA while in the presence of Jarold Motto, [place].   ***   Jarold Motto, PA-C [facility]

## 2022-10-29 ENCOUNTER — Encounter: Payer: BC Managed Care – PPO | Admitting: Physician Assistant

## 2022-12-26 ENCOUNTER — Encounter: Payer: Self-pay | Admitting: Family Medicine

## 2022-12-26 ENCOUNTER — Ambulatory Visit (INDEPENDENT_AMBULATORY_CARE_PROVIDER_SITE_OTHER): Payer: BC Managed Care – PPO | Admitting: Family Medicine

## 2022-12-26 VITALS — BP 124/82 | HR 81 | Temp 98.8°F | Ht 64.0 in | Wt 206.6 lb

## 2022-12-26 DIAGNOSIS — R197 Diarrhea, unspecified: Secondary | ICD-10-CM | POA: Diagnosis not present

## 2022-12-26 DIAGNOSIS — R1084 Generalized abdominal pain: Secondary | ICD-10-CM | POA: Diagnosis not present

## 2022-12-26 DIAGNOSIS — K529 Noninfective gastroenteritis and colitis, unspecified: Secondary | ICD-10-CM

## 2022-12-26 LAB — COMPREHENSIVE METABOLIC PANEL
ALT: 19 U/L (ref 0–35)
AST: 22 U/L (ref 0–37)
Albumin: 4.5 g/dL (ref 3.5–5.2)
Alkaline Phosphatase: 60 U/L (ref 39–117)
BUN: 9 mg/dL (ref 6–23)
CO2: 24 meq/L (ref 19–32)
Calcium: 9.8 mg/dL (ref 8.4–10.5)
Chloride: 100 meq/L (ref 96–112)
Creatinine, Ser: 0.84 mg/dL (ref 0.40–1.20)
GFR: 91.07 mL/min (ref >60.00)
Glucose, Bld: 89 mg/dL (ref 70–99)
Potassium: 4 meq/L (ref 3.5–5.1)
Sodium: 135 meq/L (ref 135–145)
Total Bilirubin: 1.5 mg/dL — ABNORMAL HIGH (ref 0.2–1.2)
Total Protein: 7.8 g/dL (ref 6.0–8.3)

## 2022-12-26 LAB — CBC WITH DIFFERENTIAL/PLATELET
Basophils Absolute: 0 10*3/uL (ref 0.0–0.1)
Basophils Relative: 0.4 % (ref 0.0–3.0)
Eosinophils Absolute: 0.1 10*3/uL (ref 0.0–0.7)
Eosinophils Relative: 1 % (ref 0.0–5.0)
HCT: 38.3 % (ref 36.0–46.0)
Hemoglobin: 12.8 g/dL (ref 12.0–15.0)
Lymphocytes Relative: 12.6 % (ref 12.0–46.0)
Lymphs Abs: 0.9 10*3/uL (ref 0.7–4.0)
MCHC: 33.4 g/dL (ref 30.0–36.0)
MCV: 92.2 fL (ref 78.0–100.0)
Monocytes Absolute: 0.4 10*3/uL (ref 0.1–1.0)
Monocytes Relative: 5.8 % (ref 3.0–12.0)
Neutro Abs: 5.8 10*3/uL (ref 1.4–7.7)
Neutrophils Relative %: 80.2 % — ABNORMAL HIGH (ref 43.0–77.0)
Platelets: 397 10*3/uL (ref 150.0–400.0)
RBC: 4.16 Mil/uL (ref 3.87–5.11)
RDW: 14.1 % (ref 11.5–15.5)
WBC: 7.2 10*3/uL (ref 4.0–10.5)

## 2022-12-26 LAB — LIPASE: Lipase: 11 U/L (ref 11.0–59.0)

## 2022-12-26 NOTE — Progress Notes (Signed)
Subjective  CC:  Chief Complaint  Patient presents with   no appetite    No appetite, feeling too full, & pain after eating    Same day acute visit; PCP not available. New pt to me. Chart reviewed.  HPI: Mary [name] is a 32 y.o. female who presents to the office today to address the problems listed above in the chief complaint. 32 year old healthy female presents due to lack of appetite for 5 days.  She reports she has had no appetite, and when tries to eat will get upper abdominal pain.  She ate to [place] fries last night and had upper abdominal cramping type pain.  This did resolve.  She had some mild lower abdominal pain this morning.  She reports loose watery stools 2-3 times daily over the last 5 days.  No blood or mucus in the stool.  No fever.  However no appetite and occasional nausea.  No vomiting.  No recent travel or new food types.  No sick contacts.  No history of GI problems or IBS.  She feels well now but still was unable to eat well.  Assessment  1. Abdominal pain, generalized   2. Diarrhea of presumed infectious origin   3. Gastroenteritis      Plan  Suspect gastroenteritis with crampy abdominal pain: Educated and reassured.  Will check lab work to make sure no other problems are active.  Trial of Pepcid AC and Imodium if needed.  Recommend Gatorade and good hydration.  Follow-up in 72 hours if not resolved.  To ER for severe abdominal pain Could consider pregnancy test if not improving.  I did not check that today.  Chart says not sexually active currently.  Follow up: As needed 01/28/2023  Orders Placed This Encounter  Procedures   CBC with Differential/Platelet   Comprehensive metabolic panel   Lipase   No orders of the defined types were placed in this encounter.     I reviewed the patients updated PMH, FH, and SocHx.    Patient Active Problem List   Diagnosis Date Noted   Umbilical hernia 05/26/2012   Genitourinary tract infection in mother  during pregnancy, antepartum 05/26/2012   Encounter for supervision of normal pregnancy in multigravida 03/31/2012   Goiter 05/11/2008   Current Meds  Medication Sig   Multiple Vitamin (MULTIVITAMIN) tablet Take 1 tablet by mouth daily.    Allergies: Patient is allergic to procardia [nifedipine]. Family History: Patient family history includes Cancer in her paternal grandmother; Congestive Heart Failure in her paternal grandmother; Diabetes in her paternal grandfather; Hypertension in her mother and paternal grandfather; Lupus in her paternal aunt. Social History:  Patient  reports that she has never smoked. She has never used smokeless tobacco. She reports that she does not drink alcohol and does not use drugs.  Review of Systems: Constitutional: Negative for fever malaise or anorexia Cardiovascular: negative for chest pain Respiratory: negative for SOB or persistent cough Gastrointestinal: negative for abdominal pain  Objective  Vitals: BP 124/82   Pulse 81   Temp 98.8 F (37.1 C)   Ht 5\' 4"  (1.626 m)   Wt 206 lb 9.6 oz (93.7 kg)   SpO2 98%   BMI 35.46 kg/m  General: no acute distress , A&Ox3, appears well.  Moves easily to exam table HEENT: PEERL, conjunctiva normal, neck is supple Cardiovascular:  RRR without murmur or gallop.  Respiratory:  Good breath sounds bilaterally, CTAB with normal respiratory effort Gastrointestinal: soft, flat abdomen, normal  active bowel sounds, no palpable masses, no hepatosplenomegaly, no appreciated hernias Nontender Skin:  Warm, no rashes  Commons side effects, risks, benefits, and alternatives for medications and treatment plan prescribed today were discussed, and the patient expressed understanding of the given instructions. Patient is instructed to call or message via MyChart if he/she has any questions or concerns regarding our treatment plan. No barriers to understanding were identified. We discussed Red Flag symptoms and signs in  detail. Patient expressed understanding regarding what to do in case of urgent or emergency type symptoms.  Medication list was reconciled, printed and provided to the patient in AVS. Patient instructions and summary information was reviewed with the patient as documented in the AVS. This note was prepared with assistance of Dragon voice recognition software. Occasional wrong-word or sound-a-like substitutions may have occurred due to the inherent limitations of voice recognition software

## 2022-12-27 NOTE — Progress Notes (Signed)
Please call patient. How is she doing? Feeling better? Labs show an elevated bilirubin, or liver test. This could be due to dehydration but could also be due to gallstones as the cause of her symptoms. If she is better, then I'd recommend repeating a CMP in a week. If she is still with some abdominal pain, then order a RUQ ultrasound to look at the liver and gallbladder.  thanks

## 2022-12-30 ENCOUNTER — Telehealth: Payer: Self-pay | Admitting: Physician Assistant

## 2022-12-30 ENCOUNTER — Other Ambulatory Visit: Payer: Self-pay

## 2022-12-30 DIAGNOSIS — R17 Unspecified jaundice: Secondary | ICD-10-CM

## 2022-12-30 NOTE — Telephone Encounter (Signed)
-----   Message from [facility] C sent at 12/30/2022  3:06 PM EDT ----- Left pt detailed message of lab results/recommendations, awaiting c/b from pt to let us know if she is still having pain or not. Will order CMP for 1 week lab appt.   Admin: Please contact pt to schedule lab appt in 1 week

## 2022-12-30 NOTE — Telephone Encounter (Signed)
Called patient and LVM informing her to call back to schedule lab visit for 1 week re-check.

## 2022-12-30 NOTE — Telephone Encounter (Signed)
Noted  

## 2023-01-14 NOTE — Progress Notes (Shared)
Subjective:    Mary [name] is a 32 y.o. female and is here for a comprehensive physical exam.  HPI  There are no preventive care reminders to display for this patient.  Acute Concerns: ***  Chronic Issues: No chronic health conditions.    Health Maintenance: Immunizations -- UTD on tetanus vaccine. Colonoscopy -- N/A Mammogram -- N/A PAP -- Normal on  10/08/21. Bone Density -- N/A Diet -- *** Exercise -- ***  Sleep habits -- *** Mood -- ***  UTD with dentist? - *** UTD with eye doctor? - ***  Weight history: Wt Readings from Last 10 Encounters:  12/26/22 206 lb 9.6 oz (93.7 kg)  10/08/21 199 lb (90.3 kg)  01/18/21 185 lb (83.9 kg)  10/22/20 200 lb (90.7 kg)  09/27/20 206 lb 3.2 oz (93.5 kg)  05/11/20 190 lb (86.2 kg)  09/26/19 211 lb (95.7 kg)  08/12/19 208 lb 6.1 oz (94.5 kg)  05/07/18 200 lb (90.7 kg)  12/07/16 197 lb 12.8 oz (89.7 kg)   There is no height or weight on file to calculate BMI. No LMP recorded.  Alcohol use:  reports no history of alcohol use.  Tobacco use:  Tobacco Use: Low Risk  (12/26/2022)   Patient History    Smoking Tobacco Use: Never    Smokeless Tobacco Use: Never    Passive Exposure: Not on file   Eligible for lung cancer screening? ***     12/26/2022    9:36 AM  Depression screen PHQ 2/9  Decreased Interest 0  Down, Depressed, Hopeless 0  PHQ - 2 Score 0     Other providers/specialists: Patient Care Team: Jarold Motto, [place] as PCP - General (Physician Assistant)    PMHx, SurgHx, SocialHx, Medications, and Allergies were reviewed in the Visit Navigator and updated as appropriate.   Past Medical History:  Diagnosis Date   Gestational diabetes    during both pregnancies   Pregnancy induced hypertension 06-04-11   gestational hypertention   Thyroid enlarged      Past Surgical History:  Procedure Laterality Date   NO PAST SURGERIES     VAGINAL DELIVERY  2013 2014     Family History  Problem  Relation Age of Onset   Hypertension Mother    Cancer Paternal Grandmother        breast or gyn cancer   Congestive Heart Failure Paternal Grandmother    Diabetes Paternal Grandfather    Hypertension Paternal Grandfather    Lupus Paternal Aunt    Colon cancer Neg Hx     Social History   Tobacco Use   Smoking status: Never   Smokeless tobacco: Never  Vaping Use   Vaping status: Never Used  Substance Use Topics   Alcohol use: No   Drug use: No    Review of Systems:   ROS  Objective:   There were no vitals taken for this visit. There is no height or weight on file to calculate BMI.   General Appearance:    Alert, cooperative, no distress, appears stated age  Head:    Normocephalic, without obvious abnormality, atraumatic  Eyes:    PERRL, conjunctiva/corneas clear, EOM's intact, fundi    benign, both eyes  Ears:    Normal TM's and external ear canals, both ears  Nose:   Nares normal, septum midline, mucosa normal, no drainage    or sinus tenderness  Throat:   Lips, mucosa, and tongue normal; teeth and gums normal  Neck:  Supple, symmetrical, trachea midline, no adenopathy;    thyroid:  no enlargement/tenderness/nodules; no carotid   bruit or JVD  Back:     Symmetric, no curvature, ROM normal, no CVA tenderness  Lungs:     Clear to auscultation bilaterally, respirations unlabored  Chest Wall:    No tenderness or deformity   Heart:    Regular rate and rhythm, S1 and S2 normal, no murmur, rub or gallop  Breast Exam:    ***No tenderness, masses, or nipple abnormality  Abdomen:     Soft, non-tender, bowel sounds active all four quadrants,    no masses, no organomegaly  Genitalia:    ***Normal female without lesion, discharge or tenderness  Extremities:   Extremities normal, atraumatic, no cyanosis or edema  Pulses:   2+ and symmetric all extremities  Skin:   Skin color, texture, turgor normal, no rashes or lesions  Lymph nodes:   Cervical, supraclavicular, and axillary  nodes normal  Neurologic:   CNII-XII intact, normal strength, sensation and reflexes    throughout    Assessment/Plan:   ***   I,Alexander Ruley,acting as a scribe for [employer], PA.,have documented all relevant documentation on the behalf of Jarold Motto, PA,as directed by  Jarold Motto, PA while in the presence of Jarold Motto, [place].   ***   Jarold Motto, PA-C [facility]

## 2023-01-28 ENCOUNTER — Encounter: Payer: BC Managed Care – PPO | Admitting: Physician Assistant

## 2023-03-09 ENCOUNTER — Encounter: Payer: BC Managed Care – PPO | Admitting: Physician Assistant

## 2023-03-25 ENCOUNTER — Encounter: Payer: BC Managed Care – PPO | Admitting: Physician Assistant

## 2023-04-07 ENCOUNTER — Telehealth: Payer: Self-pay | Admitting: Physician Assistant

## 2023-04-07 DIAGNOSIS — J383 Other diseases of vocal cords: Secondary | ICD-10-CM

## 2023-04-07 NOTE — Telephone Encounter (Unsigned)
Copied from CRM [id number]. Topic: Referral - Request for Referral >> Apr 07, 2023  1:21 PM Deaijah H wrote: Did the patient discuss referral with their provider in the last year? Yes (If No - schedule appointment) (If Yes - send message)  Appointment offered? No  Type of order/referral and detailed reason for visit: ENT/ throat issues  Preference of office, provider, location: [facility]   If referral order, have you been seen by this specialty before? No (If Yes, this issue or another issue? When? Where?  Can we respond through MyChart? Yes

## 2023-04-07 NOTE — Telephone Encounter (Signed)
Okay, to send referral or does pt need an appt?

## 2023-04-08 NOTE — Telephone Encounter (Signed)
Spoke to pt told her order for referral to ENT has been done as requested, someone will contact you to schedule an appt. Pt verbalized understanding.

## 2023-04-14 ENCOUNTER — Encounter: Payer: BC Managed Care – PPO | Admitting: Physician Assistant

## 2023-06-22 ENCOUNTER — Other Ambulatory Visit (HOSPITAL_COMMUNITY)
Admission: RE | Admit: 2023-06-22 | Discharge: 2023-06-22 | Disposition: A | Discharge status: Home / Self Care | Source: Ambulatory Visit | Attending: Physician Assistant | Admitting: Physician Assistant

## 2023-06-22 ENCOUNTER — Institutional Professional Consult (permissible substitution) (INDEPENDENT_AMBULATORY_CARE_PROVIDER_SITE_OTHER): Payer: Medicaid Other | Admitting: Otolaryngology

## 2023-06-22 ENCOUNTER — Encounter: Payer: Self-pay | Admitting: Physician Assistant

## 2023-06-22 ENCOUNTER — Ambulatory Visit (INDEPENDENT_AMBULATORY_CARE_PROVIDER_SITE_OTHER): Admitting: Otolaryngology

## 2023-06-22 ENCOUNTER — Encounter (INDEPENDENT_AMBULATORY_CARE_PROVIDER_SITE_OTHER): Payer: Self-pay | Admitting: Otolaryngology

## 2023-06-22 ENCOUNTER — Ambulatory Visit (INDEPENDENT_AMBULATORY_CARE_PROVIDER_SITE_OTHER): Payer: Medicaid Other | Admitting: Physician Assistant

## 2023-06-22 VITALS — BP 110/80 | HR 68 | Temp 97.3°F | Ht 64.0 in | Wt 219.0 lb

## 2023-06-22 VITALS — BP 141/81 | HR 80 | Ht 64.0 in | Wt 219.0 lb

## 2023-06-22 DIAGNOSIS — E669 Obesity, unspecified: Secondary | ICD-10-CM | POA: Diagnosis not present

## 2023-06-22 DIAGNOSIS — Z0000 Encounter for general adult medical examination without abnormal findings: Secondary | ICD-10-CM

## 2023-06-22 DIAGNOSIS — J383 Other diseases of vocal cords: Secondary | ICD-10-CM

## 2023-06-22 DIAGNOSIS — J312 Chronic pharyngitis: Secondary | ICD-10-CM | POA: Diagnosis not present

## 2023-06-22 DIAGNOSIS — Z0001 Encounter for general adult medical examination with abnormal findings: Secondary | ICD-10-CM

## 2023-06-22 DIAGNOSIS — J342 Deviated nasal septum: Secondary | ICD-10-CM

## 2023-06-22 DIAGNOSIS — R490 Dysphonia: Secondary | ICD-10-CM | POA: Diagnosis not present

## 2023-06-22 DIAGNOSIS — E049 Nontoxic goiter, unspecified: Secondary | ICD-10-CM

## 2023-06-22 DIAGNOSIS — K219 Gastro-esophageal reflux disease without esophagitis: Secondary | ICD-10-CM

## 2023-06-22 DIAGNOSIS — J385 Laryngeal spasm: Secondary | ICD-10-CM

## 2023-06-22 DIAGNOSIS — Z113 Encounter for screening for infections with a predominantly sexual mode of transmission: Secondary | ICD-10-CM

## 2023-06-22 DIAGNOSIS — R0982 Postnasal drip: Secondary | ICD-10-CM | POA: Diagnosis not present

## 2023-06-22 DIAGNOSIS — R519 Headache, unspecified: Secondary | ICD-10-CM

## 2023-06-22 DIAGNOSIS — J343 Hypertrophy of nasal turbinates: Secondary | ICD-10-CM

## 2023-06-22 DIAGNOSIS — R739 Hyperglycemia, unspecified: Secondary | ICD-10-CM | POA: Diagnosis not present

## 2023-06-22 DIAGNOSIS — J3089 Other allergic rhinitis: Secondary | ICD-10-CM

## 2023-06-22 DIAGNOSIS — E559 Vitamin D deficiency, unspecified: Secondary | ICD-10-CM | POA: Diagnosis not present

## 2023-06-22 LAB — CBC WITH DIFFERENTIAL/PLATELET
Basophils Absolute: 0 10*3/uL (ref 0.0–0.1)
Basophils Relative: 1 % (ref 0.0–3.0)
Eosinophils Absolute: 0.1 10*3/uL (ref 0.0–0.7)
Eosinophils Relative: 1.6 % (ref 0.0–5.0)
HCT: 33.5 % — ABNORMAL LOW (ref 36.0–46.0)
Hemoglobin: 11.5 g/dL — ABNORMAL LOW (ref 12.0–15.0)
Lymphocytes Relative: 33.4 % (ref 12.0–46.0)
Lymphs Abs: 1.5 10*3/uL (ref 0.7–4.0)
MCHC: 34.4 g/dL (ref 30.0–36.0)
MCV: 92 fl (ref 78.0–100.0)
Monocytes Absolute: 0.3 10*3/uL (ref 0.1–1.0)
Monocytes Relative: 6.2 % (ref 3.0–12.0)
Neutro Abs: 2.5 10*3/uL (ref 1.4–7.7)
Neutrophils Relative %: 57.8 % (ref 43.0–77.0)
Platelets: 325 10*3/uL (ref 150.0–400.0)
RBC: 3.64 Mil/uL — ABNORMAL LOW (ref 3.87–5.11)
RDW: 14.5 % (ref 11.5–15.5)
WBC: 4.4 10*3/uL (ref 4.0–10.5)

## 2023-06-22 LAB — VITAMIN D 25 HYDROXY (VIT D DEFICIENCY, FRACTURES): VITD: 15.18 ng/mL — ABNORMAL LOW (ref 30.00–100.00)

## 2023-06-22 LAB — COMPREHENSIVE METABOLIC PANEL WITH GFR
ALT: 9 U/L (ref 0–35)
AST: 12 U/L (ref 0–37)
Albumin: 4.3 g/dL (ref 3.5–5.2)
Alkaline Phosphatase: 55 U/L (ref 39–117)
BUN: 13 mg/dL (ref 6–23)
CO2: 25 meq/L (ref 19–32)
Calcium: 9 mg/dL (ref 8.4–10.5)
Chloride: 106 meq/L (ref 96–112)
Creatinine, Ser: 0.86 mg/dL (ref 0.40–1.20)
GFR: 88.23 mL/min (ref >60.00)
Glucose, Bld: 109 mg/dL — ABNORMAL HIGH (ref 70–99)
Potassium: 4.2 meq/L (ref 3.5–5.1)
Sodium: 138 meq/L (ref 135–145)
Total Bilirubin: 0.6 mg/dL (ref 0.2–1.2)
Total Protein: 6.6 g/dL (ref 6.0–8.3)

## 2023-06-22 LAB — LIPID PANEL
Cholesterol: 189 mg/dL (ref 0–200)
HDL: 39.6 mg/dL (ref >39.00)
LDL Cholesterol: 133 mg/dL — ABNORMAL HIGH (ref 0–99)
NonHDL: 149.6
Total CHOL/HDL Ratio: 5
Triglycerides: 83 mg/dL (ref 0.0–149.0)
VLDL: 16.6 mg/dL (ref 0.0–40.0)

## 2023-06-22 LAB — IBC + FERRITIN
Ferritin: 15.7 ng/mL (ref 10.0–291.0)
Iron: 61 ug/dL (ref 42–145)
Saturation Ratios: 15.3 % — ABNORMAL LOW (ref 20.0–50.0)
TIBC: 397.6 ug/dL (ref 250.0–450.0)
Transferrin: 284 mg/dL (ref 212.0–360.0)

## 2023-06-22 LAB — VITAMIN B12: Vitamin B-12: 245 pg/mL (ref 211–911)

## 2023-06-22 LAB — TSH: TSH: 1.08 u[IU]/mL (ref 0.35–5.50)

## 2023-06-22 LAB — HEMOGLOBIN A1C: Hgb A1c MFr Bld: 6.1 % (ref 4.6–6.5)

## 2023-06-22 MED ORDER — FAMOTIDINE 20 MG PO TABS: 20.0000 mg | ORAL_TABLET | Freq: Two times a day (BID) | ORAL | 1 refills | Status: AC

## 2023-06-22 MED ORDER — FLUTICASONE PROPIONATE 50 MCG/ACT NA SUSP: 2.0000 | Freq: Every day | NASAL | 6 refills | Status: AC

## 2023-06-22 MED ORDER — CETIRIZINE HCL 10 MG PO TABS: 10.0000 mg | ORAL_TABLET | Freq: Every day | ORAL | 11 refills | Status: AC

## 2023-06-22 NOTE — Progress Notes (Signed)
 ENT CONSULT:  Reason for Consult: dysphonia   HPI: Discussed the use of AI scribe software for clinical note transcription with the patient, who gave verbal consent to proceed.  History of Present Illness Mary [name] is a 33 year old female who presents with voice changes and throat discomfort while singing.   She experiences voice changes and throat discomfort, describing a sensation 'like glass or something was cutting my throat' when singing. This symptom initially resolved on its own but has recurred multiple times, most recently the day before the visit. She also describes a feeling of her throat closing up, preventing her from producing sound while singing, and notes that singing causes pain and pressure, as if she has been singing for an extended period.  She denies any history of asthma but is open to testing. No trouble with swallowing or shortness of breath outside of singing. Unsure about hx of environmental  allergies or postnasal drainage. She reports occasional heartburn or reflux but states it is not common.  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 She has professional singing experience and has worked with a Primary school teacher in the past but has not worked with a Human resources officer before. She notes a change in her vocal range after having her children, who are now 3 and ten years old, but did not pay much attention to it until her recent symptoms began. Prolonged voice use exacerbates her symptoms.  She has thyroid issues, specifically an enlarged thyroid, with no recent imaging since college. She has not had recent thyroid imaging since an ultrasound in 2010 and is unsure if her weight fluctuations are related to her thyroid condition.   Records Reviewed:  PCP office visit 09/27/20 Patient is in today for complete physical exam.  Patient denies any concerns.  She has a history of thyromegaly and gestational  diabetes.  Reports exercising over the last 3 weeks and doing some intermittent fasting.  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  Has 2 children.  She is currently on her menses today and declines Pap smear but will return in the future to have it performed.     Past Medical History:  Diagnosis Date   Gestational diabetes    during both pregnancies   Pregnancy induced hypertension 06-04-11   gestational hypertention   Thyroid enlarged     Past Surgical History:  Procedure Laterality Date   NO PAST SURGERIES     VAGINAL DELIVERY  2013 2014    Family History  Problem Relation Age of Onset   Hypertension Mother    Cancer Paternal Grandmother        breast or gyn cancer   Congestive Heart Failure Paternal Grandmother    Diabetes Paternal Grandfather    Hypertension Paternal Grandfather    Lupus Paternal Aunt    Colon cancer Neg Hx     Social History:  reports that she has never smoked. She has never used smokeless tobacco. She reports that she does not drink alcohol and does not use drugs.  Allergies:  Allergies  Allergen Reactions   Procardia [Nifedipine] Nausea And Vomiting    Can take with zofran per patient    Medications: I have reviewed the patient's current medications.  The PMH, PSH, Medications, Allergies, and SH were reviewed and updated.  ROS: Constitutional: Negative for fever, weight loss and weight gain. Cardiovascular: Negative  for chest pain and dyspnea on exertion. Respiratory: Is not experiencing shortness of breath at rest. Gastrointestinal: Negative for nausea and vomiting. Neurological: Negative for headaches. Psychiatric: The patient is not nervous/anxious  Blood pressure (!) 141/81, pulse 80, height 5\' 4"  (1.626 m), weight 219 lb (99.3 kg), last menstrual period 06/17/2023, SpO2 99%. Body mass index is 37.59 kg/m.  PHYSICAL EXAM:  Exam: General: Well-developed, well-nourished Communication and Voice:  Clear pitch and clarity Respiratory Respiratory effort: Equal inspiration and expiration without stridor Cardiovascular Peripheral Vascular: Warm extremities with equal color/perfusion Eyes: No nystagmus with equal extraocular motion bilaterally Neuro/Psych/Balance: Patient oriented to person, place, and time; Appropriate mood and affect; Gait is intact with no imbalance; Cranial nerves I-XII are intact Head and Face Inspection: Normocephalic and atraumatic without mass or lesion Palpation: Facial skeleton intact without bony stepoffs Salivary Glands: No mass or tenderness Facial Strength: Facial motility symmetric and full bilaterally ENT Pinna: External ear intact and fully developed External canal: Canal is patent with intact skin Tympanic Membrane: Clear and mobile External Nose: No scar or anatomic deformity Internal Nose: Septum is deviated to the left. No polyp, or purulence. Mucosal edema and erythema present.  Bilateral inferior turbinate hypertrophy.  Lips, Teeth, and gums: Mucosa and teeth intact and viable TMJ: No pain to palpation with full mobility Oral cavity/oropharynx: No erythema or exudate, no lesions present 2+ tonsils Nasopharynx: No mass or lesion with intact mucosa Hypopharynx: Intact mucosa without pooling of secretions Larynx Glottic: Full true vocal cord mobility without lesion or mass Supraglottic: Normal appearing epiglottis and AE folds Interarytenoid Space: Moderate pachydermia&edema Subglottic Space: Patent without lesion or edema Neck Neck and Trachea: Midline trachea without mass or lesion Thyroid: No mass or nodularity Lymphatics: No lymphadenopathy  Procedure: Preoperative diagnosis: dysphonia  Postoperative diagnosis:   Same + GERD LPR  Procedure: Flexible fiberoptic laryngoscopy  Surgeon: Ashok Croon, MD  Anesthesia: Topical lidocaine and Afrin Complications: None Condition is stable throughout exam  Indications and consent:  The  patient presents to the clinic with above symptoms. Indirect laryngoscopy view was incomplete. Thus it was recommended that they undergo a flexible fiberoptic laryngoscopy. All of the risks, benefits, and potential complications were reviewed with the patient preoperatively and verbal informed consent was obtained.  Procedure: The patient was seated upright in the clinic. Topical lidocaine and Afrin were applied to the nasal cavity. After adequate anesthesia had occurred, I then proceeded to pass the flexible telescope into the nasal cavity. The nasal cavity was patent without rhinorrhea or polyp. The nasopharynx was also patent without mass or lesion. The base of tongue was visualized and was normal. There were no signs of pooling of secretions in the piriform sinuses. The true vocal folds were mobile bilaterally. There were no signs of glottic or supraglottic mucosal lesion or mass. There was moderate interarytenoid pachydermia and post cricoid edema. The telescope was then slowly withdrawn and the patient tolerated the procedure throughout.   Studies Reviewed: Thyroid U/S 05/12/2008 No nodules, normal size thyroid gland Findings: The thyroid gland is within normal limits in size.  The  right lobe measures 5.2 x 1.0 x 1.8 cm.  Left lobe measures 5.1 x  1.3 x 1.9 cm.  Thyroid isthmus measures 3 mm in thickness.    The thyroid gland is homogeneous in echotexture.  No thyroid  nodules or masses are identified.  No sonographic abnormality  identified within the adjacent next soft tissues.   TSH normal 06/22/23   Assessment/Plan: Encounter Diagnoses  Name Primary?   Dysphonia Yes   Hoarseness    Environmental and seasonal allergies    Enlarged thyroid    Chronic GERD    Nasal septal deviation    Hypertrophy of both inferior nasal turbinates    Post-nasal drip    Chronic sore throat    Laryngospasm    Vocal cord dysfunction     Assessment and Plan Assessment & Plan Dysphonia with  reduced range and throat discomfort when singing.  Intermittent voice changes with sensation of throat closure and pain, likely due to laryngospasm from reflux or postnasal drainage vs VCD. Occupational voice use may contribute. Flexible scope exam without masses or lesions but findings c/w GERD LPR and post-nasal drainage. Discussed management options and she prefers non-invasive treatments. Discussed speech therapy benefits but she would like to hold off at this time - Trial Flonase and Zyrtec for allergies. - Trial of Pepcid for reflux. - Provided dietary modification information. - Recommend Reflux Gourmet supplement. - Gargle with salt water regularly. - Consider voice therapy for voice hygiene and rescue breathing techniques. - Refer to pulmonary for asthma testing 2/2 sx of throat closing off.  Enlarged thyroid Enlarged thyroid with no symptoms of thyroid dysfunction. No recent imaging (had normal thyroid U/S in 2010). TSH normal.  - Order thyroid ultrasound.   Thank you for allowing me to participate in the care of this patient. Please do not hesitate to contact me with any questions or concerns.   Artice Last, MD Otolaryngology [facility] Phone: [phone] Fax: [phone]    06/22/2023, 6:24 PM

## 2023-06-22 NOTE — Progress Notes (Signed)
 Subjective:    Mary [name] is a 33 y.o. female and is here for a comprehensive physical exam.  HPI  Acute Concerns: Headaches / "Pressure spikes" Pt reports right-sided headaches described as "pressure spikes" or "pops", ongoing for about 1 month.  Her headaches occur sporadically and intermittently with no known triggers.  Episodes have lasted as long as 5 minutes before self-resolving.  States episodes occur whether she is lying down or sitting up. Occasionally has had to take NSAIDs to manage her symptoms.  She does endorses some numbness/tingling, however she is unsure if this may be her "over thinking it".  Does not occur daily.  Pt denies family hx of brain aneurysms and caffeine intake. Denies any weakness.   Chronic Issues: Weight management Pt reports struggling with weight management for some time.  She states she has not been doing well with diet/exercise due to "not feeling motivated" recently. She attributes her lack of motivation to her mindset and her busy lifestyle with work and being a mom. She is interested in getting back on track with prioritizing her health and hopes to start making healthier eating choices and exercising in the future.   Health Maintenance: Immunizations -- UTD Colonoscopy -- N/a Mammogram -- N/a PAP -- UTD, last done 10/08/21. Results were NILM.  Bone Density -- N/a Diet -- No restrictive diets. Plans to work on healthier eating habits.  Exercise -- No exercise, but is wanting to work on this.   Sleep habits -- Reports poor sleep quality. Is getting a new bed delivered soon and believes this will help.  Mood -- Overall stable.   UTD with dentist? - Yes.  UTD with eye doctor? - No, but notes that she should schedule an appt soon.   Weight history: Wt Readings from Last 10 Encounters:  06/22/23 219 lb (99.3 kg)  12/26/22 206 lb 9.6 oz (93.7 kg)  10/08/21 199 lb (90.3 kg)  01/18/21 185 lb (83.9 kg)  10/22/20 200 lb (90.7 kg)   09/27/20 206 lb 3.2 oz (93.5 kg)  05/11/20 190 lb (86.2 kg)  09/26/19 211 lb (95.7 kg)  08/12/19 208 lb 6.1 oz (94.5 kg)  05/07/18 200 lb (90.7 kg)   Body mass index is 37.59 kg/m. Patient's last menstrual period was 06/17/2023 (exact date).  Alcohol use:  reports no history of alcohol use.  Tobacco use:  Tobacco Use: Low Risk  (06/22/2023)   Patient History    Smoking Tobacco Use: Never    Smokeless Tobacco Use: Never    Passive Exposure: Not on file   Eligible for lung cancer screening? no     06/22/2023    8:15 AM  Depression screen PHQ 2/9  Decreased Interest 0  Down, Depressed, Hopeless 0  PHQ - 2 Score 0     Other providers/specialists: Patient Care Team: Jarold Motto, [place] as PCP - General (Physician Assistant)    PMHx, SurgHx, SocialHx, Medications, and Allergies were reviewed in the Visit Navigator and updated as appropriate.   Past Medical History:  Diagnosis Date   Gestational diabetes    during both pregnancies   Pregnancy induced hypertension 06-04-11   gestational hypertention   Thyroid enlarged      Past Surgical History:  Procedure Laterality Date   NO PAST SURGERIES     VAGINAL DELIVERY  2013 2014     Family History  Problem Relation Age of Onset   Hypertension Mother    Cancer Paternal Grandmother  breast or gyn cancer   Congestive Heart Failure Paternal Grandmother    Diabetes Paternal Grandfather    Hypertension Paternal Grandfather    Lupus Paternal Aunt    Colon cancer Neg Hx     Social History   Tobacco Use   Smoking status: Never   Smokeless tobacco: Never  Vaping Use   Vaping status: Never Used  Substance Use Topics   Alcohol use: No   Drug use: No    Review of Systems:   Review of Systems  Constitutional:  Negative for chills, fever, malaise/fatigue and weight loss.  HENT:  Negative for hearing loss, sinus pain and sore throat.   Respiratory:  Negative for cough and hemoptysis.   Cardiovascular:   Negative for chest pain, palpitations, leg swelling and PND.  Gastrointestinal:  Negative for abdominal pain, constipation, diarrhea, heartburn, nausea and vomiting.  Genitourinary:  Negative for dysuria, frequency and urgency.  Musculoskeletal:  Negative for back pain, myalgias and neck pain.  Skin:  Negative for itching and rash.  Neurological:  Negative for dizziness, tingling, seizures and headaches.  Endo/Heme/Allergies:  Negative for polydipsia.  Psychiatric/Behavioral:  Negative for depression. The patient is not nervous/anxious.       Objective:   BP 110/80 (BP Location: Left Arm, Patient Position: Sitting, Cuff Size: Large)   Pulse 68   Temp (!) 97.3 F (36.3 C) (Temporal)   Ht 5\' 4"  (1.626 m)   Wt 219 lb (99.3 kg)   LMP 06/17/2023 (Exact Date)   SpO2 97%   BMI 37.59 kg/m  Body mass index is 37.59 kg/m.   General Appearance:    Alert, cooperative, no distress, appears stated age  Head:    Normocephalic, without obvious abnormality, atraumatic  Eyes:    PERRL, conjunctiva/corneas clear, EOM's intact, fundi    benign, both eyes  Ears:    Normal TM's and external ear canals, both ears  Nose:   Nares normal, septum midline, mucosa normal, no drainage    or sinus tenderness  Throat:   Lips, mucosa, and tongue normal; teeth and gums normal  Neck:   Supple, symmetrical, trachea midline, no adenopathy;    thyroid:  no enlargement/tenderness/nodules; no carotid   bruit or JVD  Back:     Symmetric, no curvature, ROM normal, no CVA tenderness  Lungs:     Clear to auscultation bilaterally, respirations unlabored  Chest Wall:    No tenderness or deformity   Heart:    Regular rate and rhythm, S1 and S2 normal, no murmur, rub or gallop  Breast Exam:    No lumps, masses, tenderness appreciated  Abdomen:     Soft, non-tender, bowel sounds active all four quadrants,    no masses, no organomegaly  Genitalia:    Deferred  Extremities:   Extremities normal, atraumatic, no cyanosis or  edema  Pulses:   2+ and symmetric all extremities  Skin:   Skin color, texture, turgor normal, no rashes or lesions  Lymph nodes:   Cervical, supraclavicular, and axillary nodes normal  Neurologic:   CNII-XII intact, normal strength, sensation and reflexes    throughout    Assessment/Plan:   Routine physical examination Today patient counseled on age appropriate routine health concerns for screening and prevention, each reviewed and up to date or declined. Immunizations reviewed and up to date or declined. Labs ordered and reviewed. Risk factors for depression reviewed and negative. Hearing function and visual acuity are intact. ADLs screened and addressed as needed. Functional ability  and level of safety reviewed and appropriate. Education, counseling and referrals performed based on assessed risks today. Patient provided with a copy of personalized plan for preventive services.  Nonintractable headache, unspecified chronicity pattern, unspecified headache type Unclear etiology No red flags on exam Recommend monitoring/logging of symptom(s) and suspected triggers If symptom(s) worsen, persist, change -- she was advised to reach out and let us  know and we will advise accordingly on next steps If any stroke-like symptom(s) or "worst headache(s) of life" - she was instructed to go to the ER  Hyperglycemia Update Hemoglobin A1c and provide recommendations   Obesity, unspecified class, unspecified obesity type, unspecified whether serious comorbidity present Continue efforts at healthy diet and exercise  Vitamin D deficiency Update vitamin D and provide recommendations   Screening examination for STD (sexually transmitted disease) Completed today   I, Bernita Bristle, acting as a Neurosurgeon for Alexander Iba, [place]., have documented all relevant documentation on the behalf of Alexander Iba, [place], as directed by  Alexander Iba, PA while in the presence of Alexander Iba, [place].  I, Bernita Bristle, have reviewed all documentation for this visit. The documentation on 06/22/23 for the exam, diagnosis, procedures, and orders are all accurate and complete.  Alexander Iba, PA-C [facility]

## 2023-06-22 NOTE — Patient Instructions (Signed)

## 2023-06-22 NOTE — Patient Instructions (Addendum)
 It was great to see you!  Keep a log of your headache(s) symptom(s) and follow up with me if they worsen or change in anyway  Please go to the lab for blood work.   Our office will call you with your results unless you have chosen to receive results via MyChart.  If your blood work is normal we will follow-up each year for physicals and as scheduled for chronic medical problems.  If anything is abnormal we will treat accordingly and get you in for a follow-up.  Take care,  Dakai Braithwaite

## 2023-06-23 ENCOUNTER — Encounter: Payer: Self-pay | Admitting: Physician Assistant

## 2023-06-23 ENCOUNTER — Other Ambulatory Visit: Payer: Self-pay | Admitting: Physician Assistant

## 2023-06-23 LAB — RPR: RPR Ser Ql: NONREACTIVE

## 2023-06-23 LAB — HIV ANTIBODY (ROUTINE TESTING W REFLEX): HIV 1&2 Ab, 4th Generation: NONREACTIVE

## 2023-06-23 MED ORDER — VITAMIN D (ERGOCALCIFEROL) 1.25 MG (50000 UNIT) PO CAPS: 50000.0000 [IU] | ORAL_CAPSULE | ORAL | 0 refills | Status: DC

## 2023-06-24 ENCOUNTER — Encounter: Payer: Self-pay | Admitting: Physician Assistant

## 2023-06-24 LAB — CERVICOVAGINAL ANCILLARY ONLY
Bacterial Vaginitis (gardnerella): NEGATIVE
Candida Glabrata: NEGATIVE
Candida Vaginitis: NEGATIVE
Chlamydia: NEGATIVE
Comment: NEGATIVE
Comment: NEGATIVE
Comment: NEGATIVE
Comment: NEGATIVE
Comment: NEGATIVE
Comment: NORMAL
Neisseria Gonorrhea: NEGATIVE
Trichomonas: NEGATIVE

## 2023-07-02 ENCOUNTER — Encounter (HOSPITAL_BASED_OUTPATIENT_CLINIC_OR_DEPARTMENT_OTHER): Payer: Self-pay

## 2023-07-23 ENCOUNTER — Ambulatory Visit: Payer: Self-pay

## 2023-07-23 NOTE — Telephone Encounter (Signed)
 Noted.

## 2023-07-23 NOTE — Telephone Encounter (Signed)
 Chief Complaint: throat closing while singing Frequency: 8 months, becoming more frequent Pertinent Negatives: Patient denies CP, palpitations, SOB, vomiting, dizziness Disposition: [] ED /[] Urgent Care (no appt availability in office) / [x] Appointment(In office/virtual)/ []  [place] [facility]/ [] Home Care/ [] Refused Recommended Disposition /[] [place] [facility]/ []  Follow-up with PCP Additional Notes: Pt reports for 8 months she has been experiencing feeling like her throat is closing while she sings. Pt is very involved in choir at church. Pt states "my throat closes, I am gasping for air" when this happens. Pt denies any difficulty breathing at any other time. Pt denies difficulty breathing at this time. Pt denies CP, heart palpitations, nausea, vomiting, dizziness. Pt denies that she notices any visible neck, throat, or tonsil swelling. Pt has a hx of goiter but denies noticing any swelling there. Pt states it has been a while since she had her thyroid  checked. Pt saw an ENT who ruled out nodules. RN scheduled pt for tomorrow at 4:30pm. RN advised the pt if she becomes SOB or has difficulty breathing, CP, palpitations, nausea, dizziness, to go to the ED. Pt verbalized understanding.    Copied from CRM [id number]. Topic: Clinical - Red Word Triage >> Jul 23, 2023  9:34 AM Jenna Moan wrote: Red Word that prompted transfer to Nurse Triage: throat closing up when singing Reason for Disposition  [1] MODERATE longstanding difficulty breathing (e.g., speaks in phrases, SOB even at rest, pulse 100-120) AND [2] SAME as normal    Sensation of throat closing only while singing for 8 months. No symptoms or difficulty breathing otherwise.  Answer Assessment - Initial Assessment Questions 1. RESPIRATORY STATUS: "Describe your breathing?" (e.g., wheezing, shortness of breath, unable to speak, severe coughing)      Pt reports feeling like her throat is closing up when she sings. Only occurs when she sings.  "My throat closes, I am gasping my air, I can't get anything out. If I stop singing it relaxes it." 2. ONSET: "When did this breathing problem begin?"      8 months ago, becoming worse 3. PATTERN "Does the difficult breathing come and go, or has it been constant since it started?"      With singing 4. SEVERITY: "How bad is your breathing?" (e.g., mild, moderate, severe)    - MILD: No SOB at rest, mild SOB with walking, speaks normally in sentences, can lie down, no retractions, pulse < 100.    - MODERATE: SOB at rest, SOB with minimal exertion and prefers to sit, cannot lie down flat, speaks in phrases, mild retractions, audible wheezing, pulse 100-120.    - SEVERE: Very SOB at rest, speaks in single words, struggling to breathe, sitting hunched forward, retractions, pulse > 120      Severe - states when it occurs she can breathe out of her nose but has to stop singing  5. RECURRENT SYMPTOM: "Have you had difficulty breathing before?" If Yes, ask: "When was the last time?" and "What happened that time?"      Started 8 months ago  6. CARDIAC HISTORY: "Do you have any history of heart disease?" (e.g., heart attack, angina, bypass surgery, angioplasty)      No  7. LUNG HISTORY: "Do you have any history of lung disease?"  (e.g., pulmonary embolus, asthma, emphysema)     No  8. CAUSE: "What do you think is causing the breathing problem?"      Not sure  9. OTHER SYMPTOMS: "Do you have any other symptoms? (e.g.,  dizziness, runny nose, cough, chest pain, fever)     "I don't even know" if I have swelling to my neck. Denies chest pain. Denies dizziness. Denies palpitations. Denies N/V. Pt states during spring break a few weeks ago she was "completely shut down" and rested that week. Pt states when she starts singing and warms up it feels sore immediately. Denies visible swelling to tonsils. Diagnosed with goiter, has not noticed a difference  10. O2 SATURATION MONITOR:  "Do you use an oxygen saturation  monitor (pulse oximeter) at home?" If Yes, ask: "What is your reading (oxygen level) today?" "What is your usual oxygen saturation reading?" (e.g., 95%) N/a  Protocols used: Breathing Difficulty-A-AH

## 2023-07-24 ENCOUNTER — Encounter: Payer: Self-pay | Admitting: Family

## 2023-07-24 ENCOUNTER — Ambulatory Visit: Admitting: Family

## 2023-07-24 VITALS — BP 129/81 | HR 71 | Temp 97.7°F | Ht 64.0 in | Wt 225.0 lb

## 2023-07-24 DIAGNOSIS — E049 Nontoxic goiter, unspecified: Secondary | ICD-10-CM

## 2023-07-24 DIAGNOSIS — J385 Laryngeal spasm: Secondary | ICD-10-CM

## 2023-07-24 NOTE — Progress Notes (Signed)
 Patient ID: Mary [name], female    DOB: 1991-03-09, 33 y.o.   MRN: [id number]  Chief Complaint  Patient presents with   Shortness of Breath    Pt states when she's singing throat closing and unable to breathe. Present for 6 months.   Discussed the use of AI scribe software for clinical note transcription with the patient, who gave verbal consent to proceed.  History of Present Illness Mary [name] is a 33 year old female with an enlarged thyroid  who presents with throat constriction while singing.  She experiences throat constriction and difficulty breathing while singing, occurring more frequently over the past six months. The sensation is described as her throat 'closing up' and being unable to breathe, forcing her to stop singing and take a breath. This issue arises even during short singing sessions.  She was seen by [facility] but states she was not given a dx or told anything during the visit, no specific diagnosis was provided. She does not experience heartburn or indigestion. Her thyroid  is enlarged, known since college, with normal thyroid  levels but no recent thyroid  scan. No tonsil issues were noted by the ENT.  She uses Flonase  and allergy medications like Zyrtec  and Claritin inconsistently, with no significant sinus symptoms or nasal drainage outside of singing episodes. She does not experience anxiety or psychological symptoms related to singing performances.  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 Communicated this to patient and the recommendations from ENT specialist. She reports she was unhappy with provider and would like a new referral.  - Recommend to use the Flonase , Zyrtec , and Pepcid  daily for 1-2 weeks to assess symptom impact as recommended by ENT. - Refer to another ENT for  a second opinion. - Suggest trying 600mg  ibuprofen  or 2 generic Aleve prior to performance singing to reduce inflammation and assess symptom impact.   Enlarged thyroid  - Enlarged thyroid  noted since college. Thyroid  levels normal. Physical exam shows puffiness in thyroid  region, possibly contributing to laryngospasm. Recommend thyroid  scan.  - ENT has already ordered scan, per review of referral, pt did not return attempts to schedule. Resending order & advised pt to be on lookout for radiology call & to notify office if radiology does not call to schedule next week.        Subjective:     Outpatient Medications Prior to Visit  Medication Sig Dispense Refill   fluticasone  (FLONASE ) 50 MCG/ACT nasal spray Place 2 sprays into both nostrils daily. 16 g 6   Multiple Vitamin (MULTIVITAMIN) tablet Take 1 tablet by mouth daily.     Vitamin D , Ergocalciferol , (DRISDOL ) 1.25 MG (50000 UNIT) CAPS capsule Take 1 capsule (50,000 Units total) by mouth every 7 (seven) days. 12 capsule 0   cetirizine  (ZYRTEC ) 10 MG tablet Take 1 tablet (10 mg total) by mouth daily. (Patient not taking: Reported on 07/24/2023) 30 tablet 11   famotidine  (PEPCID ) 20 MG tablet Take 1 tablet (20 mg total) by mouth 2 (two) times daily. (Patient not taking: Reported on 07/24/2023) 30 tablet 1   No facility-administered medications prior to visit.   Past Medical History:  Diagnosis Date   Gestational diabetes    during both pregnancies   Pregnancy induced hypertension 06-04-11   gestational hypertention   Thyroid  enlarged    Past Surgical History:  Procedure Laterality Date   NO PAST SURGERIES     VAGINAL DELIVERY  2013 2014   Allergies  Allergen Reactions   Procardia  [Nifedipine ] Nausea And Vomiting    Can take with zofran  per patient      Objective:    Physical Exam Vitals and nursing note reviewed.  Constitutional:      Appearance: Normal appearance.  HENT:     Mouth/Throat:     Mouth: Mucous membranes are  moist.     Palate: No mass and lesions.     Pharynx: No pharyngeal swelling, oropharyngeal exudate, posterior oropharyngeal erythema or postnasal drip.     Tonsils: No tonsillar exudate or tonsillar abscesses.  Neck:     Thyroid : Thyromegaly present.  Cardiovascular:     Rate and Rhythm: Normal rate and regular rhythm.  Pulmonary:     Effort: Pulmonary effort is normal.     Breath sounds: Normal breath sounds.  Musculoskeletal:        General: Normal range of motion.  Skin:    General: Skin is warm and dry.  Neurological:     Mental Status: She is alert.  Psychiatric:        Mood and Affect: Mood normal.        Behavior: Behavior normal.    BP 129/81 (BP Location: Left Arm, Patient Position: Sitting, Cuff Size: Large)   Pulse 71   Temp 97.7 F (36.5 C) (Temporal)   Ht 5\' 4"  (1.626 m)   Wt 225 lb (102.1 kg)   LMP 07/07/2023 (Approximate)   SpO2 100%   BMI 38.62 kg/m  Wt Readings from Last 3 Encounters:  07/24/23 225 lb (102.1 kg)  06/22/23 219 lb (99.3 kg)  06/22/23 219 lb (99.3 kg)       Versa Gore, NP

## 2023-07-28 NOTE — Addendum Note (Signed)
 Addended by: Alvie Jolly on: 07/28/2023 02:11 PM   Modules accepted: Orders

## 2023-07-28 NOTE — Telephone Encounter (Signed)
 I reached out to pt and left a voicemail in regards to thyroid  scan. Pt was given number below to contact and schedule if she has not heard back yet.   [phone]

## 2023-08-14 ENCOUNTER — Other Ambulatory Visit

## 2023-08-24 ENCOUNTER — Other Ambulatory Visit

## 2023-12-23 ENCOUNTER — Ambulatory Visit (INDEPENDENT_AMBULATORY_CARE_PROVIDER_SITE_OTHER): Admitting: Otolaryngology

## 2024-02-03 ENCOUNTER — Other Ambulatory Visit (INDEPENDENT_AMBULATORY_CARE_PROVIDER_SITE_OTHER): Payer: Self-pay

## 2024-02-03 ENCOUNTER — Other Ambulatory Visit: Payer: Self-pay | Admitting: *Deleted

## 2024-02-03 ENCOUNTER — Encounter: Payer: Self-pay | Admitting: Physician Assistant

## 2024-02-03 ENCOUNTER — Encounter: Payer: Self-pay | Admitting: Family

## 2024-02-03 DIAGNOSIS — R790 Abnormal level of blood mineral: Secondary | ICD-10-CM

## 2024-02-03 LAB — CBC WITH DIFFERENTIAL/PLATELET
Basophils Absolute: 0 K/uL (ref 0.0–0.1)
Basophils Relative: 0.7 % (ref 0.0–3.0)
Eosinophils Absolute: 0.1 K/uL (ref 0.0–0.7)
Eosinophils Relative: 1.2 % (ref 0.0–5.0)
HCT: 34.5 % — ABNORMAL LOW (ref 36.0–46.0)
Hemoglobin: 11.4 g/dL — ABNORMAL LOW (ref 12.0–15.0)
Lymphocytes Relative: 33.8 % (ref 12.0–46.0)
Lymphs Abs: 1.5 K/uL (ref 0.7–4.0)
MCHC: 33.1 g/dL (ref 30.0–36.0)
MCV: 91.7 fl (ref 78.0–100.0)
Monocytes Absolute: 0.3 K/uL (ref 0.1–1.0)
Monocytes Relative: 6.4 % (ref 3.0–12.0)
Neutro Abs: 2.5 K/uL (ref 1.4–7.7)
Neutrophils Relative %: 57.9 % (ref 43.0–77.0)
Platelets: 330 K/uL (ref 150.0–400.0)
RBC: 3.76 Mil/uL — ABNORMAL LOW (ref 3.87–5.11)
RDW: 14.8 % (ref 11.5–15.5)
WBC: 4.3 K/uL (ref 4.0–10.5)

## 2024-02-03 LAB — IBC + FERRITIN
Ferritin: 19.1 ng/mL (ref 10.0–291.0)
Iron: 64 ug/dL (ref 42–145)
Saturation Ratios: 16.4 % — ABNORMAL LOW (ref 20.0–50.0)
TIBC: 389.2 ug/dL (ref 250.0–450.0)
Transferrin: 278 mg/dL (ref 212.0–360.0)

## 2024-02-03 MED ORDER — VITAMIN D (ERGOCALCIFEROL) 1.25 MG (50000 UNIT) PO CAPS: 50000.0000 [IU] | ORAL_CAPSULE | ORAL | 0 refills | Status: AC

## 2024-02-03 NOTE — Telephone Encounter (Signed)
 Pt called back, told her I am sorry you felt like I was giving out personal information in the lab if front of the lab techs. I was just asking about your Vit D and Vit B12 so we could recheck them if you had taken the medication. I am sorry. Pt verbalized understanding and said she understood and thanked me for apologizing. Told her next time if happens would take to a room to discuss. Pt verbalized understanding.

## 2024-02-03 NOTE — Telephone Encounter (Signed)
 Left message on voicemail to call office.

## 2024-02-07 ENCOUNTER — Ambulatory Visit: Payer: Self-pay | Admitting: Physician Assistant

## 2024-02-23 ENCOUNTER — Ambulatory Visit (HOSPITAL_BASED_OUTPATIENT_CLINIC_OR_DEPARTMENT_OTHER): Admitting: Pulmonary Disease
